# Patient Record
Sex: Male | Born: 1940 | Race: White | Hispanic: No | Marital: Married | Smoking: Former smoker
Health system: Southern US, Community
[De-identification: ages and names within clinical notes are randomized; demographics above are authoritative.]

## PROBLEM LIST (undated history)

## (undated) DIAGNOSIS — F32A Depression, unspecified: Secondary | ICD-10-CM

## (undated) DIAGNOSIS — I10 Essential (primary) hypertension: Secondary | ICD-10-CM

## (undated) DIAGNOSIS — M545 Low back pain, unspecified: Secondary | ICD-10-CM

## (undated) DIAGNOSIS — G8929 Other chronic pain: Secondary | ICD-10-CM

## (undated) DIAGNOSIS — N400 Enlarged prostate without lower urinary tract symptoms: Secondary | ICD-10-CM

## (undated) DIAGNOSIS — Z8489 Family history of other specified conditions: Secondary | ICD-10-CM

## (undated) DIAGNOSIS — J42 Unspecified chronic bronchitis: Secondary | ICD-10-CM

## (undated) DIAGNOSIS — K219 Gastro-esophageal reflux disease without esophagitis: Secondary | ICD-10-CM

## (undated) DIAGNOSIS — M1990 Unspecified osteoarthritis, unspecified site: Secondary | ICD-10-CM

## (undated) DIAGNOSIS — F329 Major depressive disorder, single episode, unspecified: Secondary | ICD-10-CM

## (undated) HISTORY — PX: MOLE REMOVAL: SHX2046

## (undated) HISTORY — PX: LIPOMA EXCISION: SHX5283

## (undated) HISTORY — PX: TONSILLECTOMY: SUR1361

---

## 2013-10-24 ENCOUNTER — Other Ambulatory Visit: Payer: Self-pay | Admitting: Internal Medicine

## 2013-10-24 DIAGNOSIS — R109 Unspecified abdominal pain: Secondary | ICD-10-CM

## 2013-10-25 ENCOUNTER — Encounter (HOSPITAL_COMMUNITY): Payer: Self-pay | Admitting: Emergency Medicine

## 2013-10-25 ENCOUNTER — Emergency Department (HOSPITAL_COMMUNITY): Payer: Medicare Other

## 2013-10-25 ENCOUNTER — Ambulatory Visit
Admission: RE | Admit: 2013-10-25 | Discharge: 2013-10-25 | Disposition: A | Discharge status: Home / Self Care | Payer: Medicare Other | Source: Ambulatory Visit | Attending: Internal Medicine | Admitting: Internal Medicine

## 2013-10-25 ENCOUNTER — Observation Stay (HOSPITAL_COMMUNITY)
Admission: EM | Admit: 2013-10-25 | Discharge: 2013-10-27 | Disposition: A | Discharge status: Home / Self Care | Payer: Medicare Other | Attending: Surgery | Admitting: Surgery

## 2013-10-25 DIAGNOSIS — R109 Unspecified abdominal pain: Secondary | ICD-10-CM

## 2013-10-25 DIAGNOSIS — K3580 Unspecified acute appendicitis: Secondary | ICD-10-CM

## 2013-10-25 DIAGNOSIS — I10 Essential (primary) hypertension: Secondary | ICD-10-CM | POA: Insufficient documentation

## 2013-10-25 DIAGNOSIS — K37 Unspecified appendicitis: Principal | ICD-10-CM | POA: Insufficient documentation

## 2013-10-25 DIAGNOSIS — F329 Major depressive disorder, single episode, unspecified: Secondary | ICD-10-CM | POA: Insufficient documentation

## 2013-10-25 DIAGNOSIS — Z87891 Personal history of nicotine dependence: Secondary | ICD-10-CM | POA: Insufficient documentation

## 2013-10-25 DIAGNOSIS — N400 Enlarged prostate without lower urinary tract symptoms: Secondary | ICD-10-CM | POA: Insufficient documentation

## 2013-10-25 DIAGNOSIS — F3289 Other specified depressive episodes: Secondary | ICD-10-CM | POA: Insufficient documentation

## 2013-10-25 HISTORY — DX: Major depressive disorder, single episode, unspecified: F32.9

## 2013-10-25 HISTORY — DX: Family history of other specified conditions: Z84.89

## 2013-10-25 HISTORY — DX: Depression, unspecified: F32.A

## 2013-10-25 HISTORY — DX: Essential (primary) hypertension: I10

## 2013-10-25 HISTORY — DX: Unspecified osteoarthritis, unspecified site: M19.90

## 2013-10-25 HISTORY — DX: Gastro-esophageal reflux disease without esophagitis: K21.9

## 2013-10-25 HISTORY — DX: Benign prostatic hyperplasia without lower urinary tract symptoms: N40.0

## 2013-10-25 LAB — CBC WITH DIFFERENTIAL/PLATELET
Basophils Absolute: 0 10*3/uL (ref 0.0–0.1)
Basophils Relative: 0 % (ref 0–1)
Eosinophils Absolute: 0 10*3/uL (ref 0.0–0.7)
Eosinophils Relative: 0 % (ref 0–5)
HCT: 43.1 % (ref 39.0–52.0)
Hemoglobin: 15.6 g/dL (ref 13.0–17.0)
Lymphocytes Relative: 6 % — ABNORMAL LOW (ref 12–46)
Lymphs Abs: 0.8 10*3/uL (ref 0.7–4.0)
MCH: 33.5 pg (ref 26.0–34.0)
MCHC: 36.2 g/dL — ABNORMAL HIGH (ref 30.0–36.0)
MCV: 92.5 fL (ref 78.0–100.0)
Monocytes Absolute: 0.4 10*3/uL (ref 0.1–1.0)
Monocytes Relative: 3 % (ref 3–12)
Neutro Abs: 12.9 10*3/uL — ABNORMAL HIGH (ref 1.7–7.7)
Neutrophils Relative %: 92 % — ABNORMAL HIGH (ref 43–77)
Platelets: 269 10*3/uL (ref 150–400)
RBC: 4.66 MIL/uL (ref 4.22–5.81)
RDW: 12.1 % (ref 11.5–15.5)
WBC: 14.1 10*3/uL — ABNORMAL HIGH (ref 4.0–10.5)

## 2013-10-25 LAB — COMPREHENSIVE METABOLIC PANEL
ALBUMIN: 3.9 g/dL (ref 3.5–5.2)
ALK PHOS: 66 U/L (ref 39–117)
ALT: 24 U/L (ref 0–53)
AST: 19 U/L (ref 0–37)
BUN: 17 mg/dL (ref 6–23)
CALCIUM: 9.4 mg/dL (ref 8.4–10.5)
CO2: 21 mEq/L (ref 19–32)
CREATININE: 0.73 mg/dL (ref 0.50–1.35)
Chloride: 96 mEq/L (ref 96–112)
GFR calc non Af Amer: >90 mL/min (ref >90)
GLUCOSE: 144 mg/dL — AB (ref 70–99)
Potassium: 3.7 mEq/L (ref 3.7–5.3)
Sodium: 134 mEq/L — ABNORMAL LOW (ref 137–147)
TOTAL PROTEIN: 7.9 g/dL (ref 6.0–8.3)
Total Bilirubin: 0.4 mg/dL (ref 0.3–1.2)

## 2013-10-25 LAB — URINALYSIS, ROUTINE W REFLEX MICROSCOPIC
Bilirubin Urine: NEGATIVE
GLUCOSE, UA: NEGATIVE mg/dL
Ketones, ur: NEGATIVE mg/dL
Leukocytes, UA: NEGATIVE
Nitrite: NEGATIVE
Protein, ur: NEGATIVE mg/dL
Specific Gravity, Urine: 1.019 (ref 1.005–1.030)
UROBILINOGEN UA: 0.2 mg/dL (ref 0.0–1.0)
pH: 6.5 (ref 5.0–8.0)

## 2013-10-25 LAB — URINE MICROSCOPIC-ADD ON

## 2013-10-25 MED ORDER — ONDANSETRON HCL 4 MG/2ML IJ SOLN: 4.0000 mg | Freq: Four times a day (QID) | INTRAMUSCULAR | Status: DC | PRN

## 2013-10-25 MED ORDER — ENOXAPARIN SODIUM 40 MG/0.4ML ~~LOC~~ SOLN
40.0000 mg | SUBCUTANEOUS | Status: DC
  Administered 2013-10-25 – 2013-10-26 (×2): 40 mg via SUBCUTANEOUS
  Filled 2013-10-25 (×3): qty 0.4

## 2013-10-25 MED ORDER — DOCUSATE SODIUM 100 MG PO CAPS
100.0000 mg | ORAL_CAPSULE | Freq: Two times a day (BID) | ORAL | Status: DC
  Administered 2013-10-25 – 2013-10-27 (×4): 100 mg via ORAL
  Filled 2013-10-25 (×5): qty 1

## 2013-10-25 MED ORDER — BIOTENE DRY MOUTH MT LIQD: 15.0000 mL | Freq: Two times a day (BID) | OROMUCOSAL | Status: DC

## 2013-10-25 MED ORDER — DEXTROSE IN LACTATED RINGERS 5 % IV SOLN
INTRAVENOUS | Status: DC
  Administered 2013-10-25: 22:00:00 via INTRAVENOUS

## 2013-10-25 MED ORDER — PIPERACILLIN-TAZOBACTAM 3.375 G IVPB
3.3750 g | Freq: Three times a day (TID) | INTRAVENOUS | Status: DC
  Administered 2013-10-25 – 2013-10-27 (×5): 3.375 g via INTRAVENOUS
  Filled 2013-10-25 (×7): qty 50

## 2013-10-25 MED ORDER — HYDROMORPHONE HCL PF 1 MG/ML IJ SOLN: 1.0000 mg | INTRAMUSCULAR | Status: DC | PRN

## 2013-10-25 MED ORDER — ACETAMINOPHEN 325 MG PO TABS: 650.0000 mg | ORAL_TABLET | Freq: Four times a day (QID) | ORAL | Status: DC | PRN

## 2013-10-25 MED ORDER — LATANOPROST 0.005 % OP SOLN
1.0000 [drp] | Freq: Every day | OPHTHALMIC | Status: DC
  Administered 2013-10-25 – 2013-10-26 (×2): 1 [drp] via OPHTHALMIC
  Filled 2013-10-25: qty 2.5

## 2013-10-25 MED ORDER — OXYCODONE HCL 5 MG PO TABS: 5.0000 mg | ORAL_TABLET | ORAL | Status: DC | PRN

## 2013-10-25 MED ORDER — IOHEXOL 300 MG/ML  SOLN
125.0000 mL | Freq: Once | INTRAMUSCULAR | Status: AC | PRN
  Administered 2013-10-25: 125 mL via INTRAVENOUS

## 2013-10-25 MED ORDER — DUTASTERIDE 0.5 MG PO CAPS
0.5000 mg | ORAL_CAPSULE | Freq: Every day | ORAL | Status: DC
  Administered 2013-10-26: 0.5 mg via ORAL
  Filled 2013-10-25 (×2): qty 1

## 2013-10-25 MED ORDER — CHLORHEXIDINE GLUCONATE 0.12 % MT SOLN
15.0000 mL | Freq: Two times a day (BID) | OROMUCOSAL | Status: DC
  Administered 2013-10-25 – 2013-10-26 (×2): 15 mL via OROMUCOSAL
  Filled 2013-10-25 (×2): qty 15

## 2013-10-25 MED ORDER — PIPERACILLIN-TAZOBACTAM 3.375 G IVPB
3.3750 g | Freq: Three times a day (TID) | INTRAVENOUS | Status: DC
  Filled 2013-10-25: qty 50

## 2013-10-25 MED ORDER — HYDROCHLOROTHIAZIDE 25 MG PO TABS
25.0000 mg | ORAL_TABLET | Freq: Every day | ORAL | Status: DC
  Administered 2013-10-26: 25 mg via ORAL
  Filled 2013-10-25 (×2): qty 1

## 2013-10-25 MED ORDER — IRBESARTAN 150 MG PO TABS
150.0000 mg | ORAL_TABLET | Freq: Every day | ORAL | Status: DC
  Filled 2013-10-25: qty 1

## 2013-10-25 MED ORDER — ACETAMINOPHEN 650 MG RE SUPP: 650.0000 mg | Freq: Four times a day (QID) | RECTAL | Status: DC | PRN

## 2013-10-25 MED ORDER — VALSARTAN-HYDROCHLOROTHIAZIDE 160-25 MG PO TABS: 1.0000 | ORAL_TABLET | Freq: Every day | ORAL | Status: DC

## 2013-10-25 MED ORDER — SENNOSIDES-DOCUSATE SODIUM 8.6-50 MG PO TABS: 1.0000 | ORAL_TABLET | Freq: Every evening | ORAL | Status: DC | PRN

## 2013-10-25 NOTE — ED Notes (Signed)
Attempted to call report, nurse unavailable.

## 2013-10-25 NOTE — ED Notes (Signed)
Paged Dr. Luisa Hartornett to [phone]

## 2013-10-25 NOTE — H&P (Signed)
Clinton Fuller is an 73 y.o. male.   Chief Complaint: RLQ abdominal pain HPI: 4 day hx of RLQ mild to moderate pain better over the last 2 days.  Started Saturday and worsened on Sunday. Location is RLQ.  Now its a 2 out of 10 dull achy.  Overall he is feeling better compared to Monday.  He has received no pain medication today. No vomiting or nausea. Bowels are moving.   Past Medical History  Diagnosis Date  . Hypertension   . Depression   . BPH (benign prostatic hyperplasia)     History reviewed. No pertinent past surgical history.  No family history on file. Social History:  reports that he has quit smoking. He does not have any smokeless tobacco history on file. He reports that he drinks alcohol. His drug history is not on file.  Allergies:  Allergies  Allergen Reactions  . Iodine Hives  . Shellfish Allergy Nausea And Vomiting  . Sulfa Antibiotics Other (See Comments)    Unknown childhood reaction     (Not in a hospital admission)  Results for orders placed during the hospital encounter of 10/25/13 (from the past 48 hour(s))  CBC WITH DIFFERENTIAL     Status: Abnormal   Collection Time    10/25/13  2:04 PM      Result Value Range   WBC 14.1 (*) 4.0 - 10.5 K/uL   RBC 4.66  4.22 - 5.81 MIL/uL   Hemoglobin 15.6  13.0 - 17.0 g/dL   HCT 43.1  39.0 - 52.0 %   MCV 92.5  78.0 - 100.0 fL   MCH 33.5  26.0 - 34.0 pg   MCHC 36.2 (*) 30.0 - 36.0 g/dL   RDW 12.1  11.5 - 15.5 %   Platelets 269  150 - 400 K/uL   Neutrophils Relative % 92 (*) 43 - 77 %   Neutro Abs 12.9 (*) 1.7 - 7.7 K/uL   Lymphocytes Relative 6 (*) 12 - 46 %   Lymphs Abs 0.8  0.7 - 4.0 K/uL   Monocytes Relative 3  3 - 12 %   Monocytes Absolute 0.4  0.1 - 1.0 K/uL   Eosinophils Relative 0  0 - 5 %   Eosinophils Absolute 0.0  0.0 - 0.7 K/uL   Basophils Relative 0  0 - 1 %   Basophils Absolute 0.0  0.0 - 0.1 K/uL  COMPREHENSIVE METABOLIC PANEL     Status: Abnormal   Collection Time    10/25/13  2:04 PM   Result Value Range   Sodium 134 (*) 137 - 147 mEq/L   Potassium 3.7  3.7 - 5.3 mEq/L   Chloride 96  96 - 112 mEq/L   CO2 21  19 - 32 mEq/L   Glucose, Bld 144 (*) 70 - 99 mg/dL   BUN 17  6 - 23 mg/dL   Creatinine, Ser 0.73  0.50 - 1.35 mg/dL   Calcium 9.4  8.4 - 10.5 mg/dL   Total Protein 7.9  6.0 - 8.3 g/dL   Albumin 3.9  3.5 - 5.2 g/dL   AST 19  0 - 37 U/L   ALT 24  0 - 53 U/L   Alkaline Phosphatase 66  39 - 117 U/L   Total Bilirubin 0.4  0.3 - 1.2 mg/dL   GFR calc non Af Amer >90  >90 mL/min   GFR calc Af Amer >90  >90 mL/min   Comment: (NOTE)     The eGFR  has been calculated using the CKD EPI equation.     This calculation has not been validated in all clinical situations.     eGFR's persistently <90 mL/min signify possible Chronic Kidney     Disease.  URINALYSIS, ROUTINE W REFLEX MICROSCOPIC     Status: Abnormal   Collection Time    10/25/13  2:28 PM      Result Value Range   Color, Urine YELLOW  YELLOW   APPearance CLEAR  CLEAR   Specific Gravity, Urine 1.019  1.005 - 1.030   pH 6.5  5.0 - 8.0   Glucose, UA NEGATIVE  NEGATIVE mg/dL   Hgb urine dipstick TRACE (*) NEGATIVE   Bilirubin Urine NEGATIVE  NEGATIVE   Ketones, ur NEGATIVE  NEGATIVE mg/dL   Protein, ur NEGATIVE  NEGATIVE mg/dL   Urobilinogen, UA 0.2  0.0 - 1.0 mg/dL   Nitrite NEGATIVE  NEGATIVE   Leukocytes, UA NEGATIVE  NEGATIVE  URINE MICROSCOPIC-ADD ON     Status: None   Collection Time    10/25/13  2:28 PM      Result Value Range   RBC / HPF 0-2  <3 RBC/hpf   Dg Chest 2 View  10/25/2013   CLINICAL DATA:  Acute appendicitis  EXAM: CHEST  2 VIEW  COMPARISON:  None.  FINDINGS: Cardiomediastinal silhouette is unremarkable. No acute infiltrate or pleural effusion. No pulmonary edema. Mild degenerative changes mid thoracic spine.  IMPRESSION: No active cardiopulmonary disease.   Electronically Signed   By: Lahoma Crocker M.D.   On: 10/25/2013 15:12   Ct Abdomen Pelvis W Contrast  10/25/2013   CLINICAL DATA:   Right lower quadrant pain for 4 days, leukocytosis, question appendicitis  EXAM: CT ABDOMEN AND PELVIS WITH CONTRAST  TECHNIQUE: Multidetector CT imaging of the abdomen and pelvis was performed using the standard protocol following bolus administration of intravenous contrast. Sagittal and coronal MPR images reconstructed from axial data set.  CONTRAST:  126m OMNIPAQUE IOHEXOL 300 MG/ML SOLN. Dilute oral contrast.  COMPARISON:  None  FINDINGS: Lung bases clear.  Liver, spleen, pancreas, kidneys, and adrenal glands normal appearance.  Thickened enlarged appendix with periappendiceal inflammatory changes and thickening of the adjacent cecal wall consistent with acute appendicitis.  No evidence of discrete abscess or perforation.  Few adjacent normal sized lymph nodes.  Stomach and bowel loops otherwise grossly normal appearance.  Minimal scattered atherosclerotic calcification.  Mild prostatic enlargement and associated bladder wall thickening.  No mass, adenopathy, free fluid, or free air.  No hernia or acute osseous findings identified.  IMPRESSION: Acute appendicitis.  Findings called to Dr. PDelfina Redwoodon 10/25/2013 at 1314 hr.   Electronically Signed   By: MLavonia DanaM.D.   On: 10/25/2013 13:15    Review of Systems  Constitutional: Negative for fever and chills.  HENT: Negative.   Eyes: Negative.   Respiratory: Negative.   Cardiovascular: Negative.   Gastrointestinal: Positive for abdominal pain.  Genitourinary: Negative.   Musculoskeletal: Negative.   Skin: Negative.   Neurological: Negative.   Endo/Heme/Allergies: Negative.   Psychiatric/Behavioral: Negative.     Blood pressure 149/80, pulse 104, temperature 98.8 F (37.1 C), weight 228 lb (103.42 kg), SpO2 96.00%. Physical Exam  Constitutional: He appears well-developed and well-nourished.  HENT:  Head: Normocephalic and atraumatic.  Eyes: No scleral icterus.  Neck: Normal range of motion.  Cardiovascular: Normal rate and regular rhythm.    Respiratory: Effort normal and breath sounds normal.  GI:  Assessment/Plan Appendicitis Discussed medical and surgical options with him and the pro and cons to each.  He has minimal pain and his exam is under impressive. He has had symptoms for 4 days and after looking at the  CT myself suspect he has perforated.  He is an ideal candidate to manage initially with antibiotics and follow since he has minimal pain.  He has no drainable collections. If he worsens ,  Surgical exploration may be necessary. He understands his options and agrees with the plan.    Selwyn Reason A. 10/25/2013, 4:49 PM

## 2013-10-25 NOTE — ED Notes (Signed)
abd pain since last  Seen by dr  was sent for  Ct and has appendicitis here for admission

## 2013-10-26 DIAGNOSIS — D72829 Elevated white blood cell count, unspecified: Secondary | ICD-10-CM

## 2013-10-26 LAB — COMPREHENSIVE METABOLIC PANEL
ALT: 20 U/L (ref 0–53)
AST: 19 U/L (ref 0–37)
Albumin: 3.3 g/dL — ABNORMAL LOW (ref 3.5–5.2)
Alkaline Phosphatase: 56 U/L (ref 39–117)
BILIRUBIN TOTAL: 0.4 mg/dL (ref 0.3–1.2)
BUN: 17 mg/dL (ref 6–23)
CALCIUM: 9 mg/dL (ref 8.4–10.5)
CHLORIDE: 99 meq/L (ref 96–112)
CO2: 22 mEq/L (ref 19–32)
CREATININE: 0.91 mg/dL (ref 0.50–1.35)
GFR calc Af Amer: >90 mL/min (ref >90)
GFR, EST NON AFRICAN AMERICAN: 84 mL/min — AB (ref >90)
Glucose, Bld: 114 mg/dL — ABNORMAL HIGH (ref 70–99)
Potassium: 3.2 mEq/L — ABNORMAL LOW (ref 3.7–5.3)
Sodium: 138 mEq/L (ref 137–147)
Total Protein: 6.6 g/dL (ref 6.0–8.3)

## 2013-10-26 LAB — CBC
HCT: 39.1 % (ref 39.0–52.0)
HEMOGLOBIN: 13.7 g/dL (ref 13.0–17.0)
MCH: 33.1 pg (ref 26.0–34.0)
MCHC: 35 g/dL (ref 30.0–36.0)
MCV: 94.4 fL (ref 78.0–100.0)
PLATELETS: 254 10*3/uL (ref 150–400)
RBC: 4.14 MIL/uL — AB (ref 4.22–5.81)
RDW: 12.3 % (ref 11.5–15.5)
WBC: 12.6 10*3/uL — AB (ref 4.0–10.5)

## 2013-10-26 MED ORDER — POTASSIUM CL IN DEXTROSE 5% 20 MEQ/L IV SOLN
20.0000 meq | INTRAVENOUS | Status: DC
  Administered 2013-10-26 – 2013-10-27 (×2): 20 meq via INTRAVENOUS
  Filled 2013-10-26 (×3): qty 1000

## 2013-10-26 NOTE — Progress Notes (Signed)
Finish 48 hours total of IV abx and transition to po abx. HOME Friday.

## 2013-10-26 NOTE — Progress Notes (Signed)
Subjective: Pt feels fine.  No N/V, and only minimal abdominal pain in RLQ.  Ambulating some, Using IS at 2250.  Hungry. Urinating well and had a BM yesterday.  Objective: Vital signs in last 24 hours: Temp:  [98.2 F (36.8 C)-98.8 F (37.1 C)] 98.2 F (36.8 C) (01/15 0535) Pulse Rate:  [67-104] 67 (01/15 0535) Resp:  [16-18] 18 (01/15 0535) BP: (130-156)/(55-81) 130/55 mmHg (01/15 0535) SpO2:  [94 %-100 %] 95 % (01/15 0535) Weight:  [228 lb (103.42 kg)] 228 lb (103.42 kg) (01/14 1954) Last BM Date: 10/25/13  Intake/Output from previous day:   Intake/Output this shift:    PE: Gen:  Alert, NAD, pleasant Abd: Soft, minimal tenderness in RLQ, ND, +BS, no HSM   Lab Results:   Recent Labs  10/25/13 1404 10/26/13 0420  WBC 14.1* 12.6*  HGB 15.6 13.7  HCT 43.1 39.1  PLT 269 254   BMET  Recent Labs  10/25/13 1404 10/26/13 0420  NA 134* 138  K 3.7 3.2*  CL 96 99  CO2 21 22  GLUCOSE 144* 114*  BUN 17 17  CREATININE 0.73 0.91  CALCIUM 9.4 9.0   PT/INR No results found for this basename: LABPROT, INR,  in the last 72 hours CMP     Component Value Date/Time   NA 138 10/26/2013 0420   K 3.2* 10/26/2013 0420   CL 99 10/26/2013 0420   CO2 22 10/26/2013 0420   GLUCOSE 114* 10/26/2013 0420   BUN 17 10/26/2013 0420   CREATININE 0.91 10/26/2013 0420   CALCIUM 9.0 10/26/2013 0420   PROT 6.6 10/26/2013 0420   ALBUMIN 3.3* 10/26/2013 0420   AST 19 10/26/2013 0420   ALT 20 10/26/2013 0420   ALKPHOS 56 10/26/2013 0420   BILITOT 0.4 10/26/2013 0420   GFRNONAA 84* 10/26/2013 0420   GFRAA >90 10/26/2013 0420   Lipase  No results found for this basename: lipase       Studies/Results: Dg Chest 2 View  10/25/2013   CLINICAL DATA:  Acute appendicitis  EXAM: CHEST  2 VIEW  COMPARISON:  None.  FINDINGS: Cardiomediastinal silhouette is unremarkable. No acute infiltrate or pleural effusion. No pulmonary edema. Mild degenerative changes mid thoracic spine.  IMPRESSION: No active  cardiopulmonary disease.   Electronically Signed   By: Natasha MeadLiviu  Pop M.D.   On: 10/25/2013 15:12   Ct Abdomen Pelvis W Contrast  10/25/2013   CLINICAL DATA:  Right lower quadrant pain for 4 days, leukocytosis, question appendicitis  EXAM: CT ABDOMEN AND PELVIS WITH CONTRAST  TECHNIQUE: Multidetector CT imaging of the abdomen and pelvis was performed using the standard protocol following bolus administration of intravenous contrast. Sagittal and coronal MPR images reconstructed from axial data set.  CONTRAST:  125mL OMNIPAQUE IOHEXOL 300 MG/ML SOLN. Dilute oral contrast.  COMPARISON:  None  FINDINGS: Lung bases clear.  Liver, spleen, pancreas, kidneys, and adrenal glands normal appearance.  Thickened enlarged appendix with periappendiceal inflammatory changes and thickening of the adjacent cecal wall consistent with acute appendicitis.  No evidence of discrete abscess or perforation.  Few adjacent normal sized lymph nodes.  Stomach and bowel loops otherwise grossly normal appearance.  Minimal scattered atherosclerotic calcification.  Mild prostatic enlargement and associated bladder wall thickening.  No mass, adenopathy, free fluid, or free air.  No hernia or acute osseous findings identified.  IMPRESSION: Acute appendicitis.  Findings called to Dr. Nehemiah SettlePolite on 10/25/2013 at 1314 hr.   Electronically Signed   By: Angelyn PuntMark  Boles M.D.  On: 10/25/2013 13:15    Anti-infectives: Anti-infectives   Start     Dose/Rate Route Frequency Ordered Stop   10/25/13 1800  piperacillin-tazobactam (ZOSYN) IVPB 3.375 g     3.375 g 12.5 mL/hr over 240 Minutes Intravenous 3 times per day 10/25/13 1749     10/25/13 1715  piperacillin-tazobactam (ZOSYN) IVPB 3.375 g  Status:  Discontinued     3.375 g 12.5 mL/hr over 240 Minutes Intravenous 3 times per day 10/25/13 1704 10/25/13 1748       Assessment/Plan Sub-acute appendicitis Leukocytosis RLQ abdominal pain  Plan: 1.  Treat conservatively with IV antibiotics (Zosyn), no  drainable fluid collection 2.  If he improves then may be able to avoid surgery or perform an interval lap appy at a later date 3.  Ambulate and IS 4.  SCD's and lovenox 5.  Advance to reg diet, switch to oral Augmentin tomorrow if WBC normal and pain minimal 6.  May be ready for d/c tomorrow    LOS: 1 day    Clinton Fuller 10/26/2013, 10:56 AM Pager: [phone]

## 2013-10-27 LAB — BASIC METABOLIC PANEL
BUN: 17 mg/dL (ref 6–23)
CHLORIDE: 100 meq/L (ref 96–112)
CO2: 24 meq/L (ref 19–32)
CREATININE: 1.05 mg/dL (ref 0.50–1.35)
Calcium: 8.4 mg/dL (ref 8.4–10.5)
GFR calc Af Amer: 81 mL/min — ABNORMAL LOW (ref >90)
GFR calc non Af Amer: 70 mL/min — ABNORMAL LOW (ref >90)
Glucose, Bld: 100 mg/dL — ABNORMAL HIGH (ref 70–99)
POTASSIUM: 3.5 meq/L — AB (ref 3.7–5.3)
Sodium: 139 mEq/L (ref 137–147)

## 2013-10-27 LAB — CBC
HEMATOCRIT: 38.3 % — AB (ref 39.0–52.0)
HEMOGLOBIN: 13.4 g/dL (ref 13.0–17.0)
MCH: 33.1 pg (ref 26.0–34.0)
MCHC: 35 g/dL (ref 30.0–36.0)
MCV: 94.6 fL (ref 78.0–100.0)
Platelets: 247 10*3/uL (ref 150–400)
RBC: 4.05 MIL/uL — AB (ref 4.22–5.81)
RDW: 12.2 % (ref 11.5–15.5)
WBC: 7.2 10*3/uL (ref 4.0–10.5)

## 2013-10-27 MED ORDER — DSS 100 MG PO CAPS: 100.0000 mg | ORAL_CAPSULE | Freq: Two times a day (BID) | ORAL | Status: DC

## 2013-10-27 MED ORDER — OXYCODONE HCL 5 MG PO TABS: 5.0000 mg | ORAL_TABLET | Freq: Four times a day (QID) | ORAL | Status: DC | PRN

## 2013-10-27 MED ORDER — AMOXICILLIN-POT CLAVULANATE 875-125 MG PO TABS: 1.0000 | ORAL_TABLET | Freq: Two times a day (BID) | ORAL | Status: DC

## 2013-10-27 NOTE — Discharge Summary (Signed)
PT DOING WELL ON MEDICAL MANAGEMENT. WILL NEED FOLLOW UP IN 10 14 DAYS AND COMPLETE ANTIBIOTICS.

## 2013-10-27 NOTE — Discharge Instructions (Signed)
Your appendicitis is being treated without surgery and with only antibiotics because you are at higher risk during surgery given the length of time of your pain.  You may or may not need to get your appendix out at a later date.  If your pain worsens please call the office for a sooner appointment.  Please take your antibiotics until they are gone.    Appendicitis Appendicitis is when the appendix is swollen (inflamed). The inflammation can lead to developing a hole (perforation) and a collection of pus (abscess). CAUSES  There is not always an obvious cause of appendicitis. Sometimes it is caused by an obstruction in the appendix. The obstruction can be caused by:  A small, hard, pea-sized ball of stool (fecalith).  Enlarged lymph glands in the appendix. SYMPTOMS   Pain around your belly button (navel) that moves toward your lower right belly (abdomen). The pain can become more severe and sharp as time passes.  Tenderness in the lower right abdomen. Pain gets worse if you cough or make a sudden movement.  Feeling sick to your stomach (nauseous).  Throwing up (vomiting).  Loss of appetite.  Fever.  Constipation.  Diarrhea.  Generally not feeling well. DIAGNOSIS   Physical exam.  Blood tests.  Urine test.  X-rays or a CT scan may confirm the diagnosis. TREATMENT  Once the diagnosis of appendicitis is made, the most common treatment is to remove the appendix as soon as possible. This procedure is called appendectomy. In an open appendectomy, a cut (incision) is made in the lower right abdomen and the appendix is removed. In a laparoscopic appendectomy, usually 3 small incisions are made. Long, thin instruments and a camera tube are used to remove the appendix. Most patients go home in 24 to 48 hours after appendectomy. In some situations, the appendix may have already perforated and an abscess may have formed. The abscess may have a "wall" around it as seen on a CT scan. In this  case, a drain may be placed into the abscess to remove fluid, and you may be treated with antibiotic medicines that kill germs. The medicine is given through a tube in your vein (IV). Once the abscess has resolved, it may or may not be necessary to have an appendectomy. You may need to stay in the hospital longer than 48 hours. Document Released: 09/28/2005 Document Revised: 03/29/2012 Document Reviewed: 12/24/2009 [facility] Patient Information 2014 [place], [place].

## 2013-10-27 NOTE — Discharge Planning (Signed)
Patient discharged home in stable condition. Verbalizes understanding of all discharge instructions, including home medications and follow up appointments. 

## 2013-10-27 NOTE — Discharge Summary (Signed)
Physician Discharge Summary  Patient ID: Clinton Fuller MRN: [id number] DOB/AGE: Jul 20, 1941 73 y.o.  Admit date: 10/25/2013 Discharge date: 10/27/2013  Admitting Diagnosis: Appendicitis Leukocytosis  Discharge Diagnosis Patient Active Problem List   Diagnosis Date Noted  . Appendicitis 10/25/2013    Consultants None  Imaging: Dg Chest 2 View  10/25/2013   CLINICAL DATA:  Acute appendicitis  EXAM: CHEST  2 VIEW  COMPARISON:  None.  FINDINGS: Cardiomediastinal silhouette is unremarkable. No acute infiltrate or pleural effusion. No pulmonary edema. Mild degenerative changes mid thoracic spine.  IMPRESSION: No active cardiopulmonary disease.   Electronically Signed   By: Clinton MeadLiviu  Fuller M.D.   On: 10/25/2013 15:12   Ct Abdomen Pelvis W Contrast  10/25/2013   CLINICAL DATA:  Right lower quadrant pain for 4 days, leukocytosis, question appendicitis  EXAM: CT ABDOMEN AND PELVIS WITH CONTRAST  TECHNIQUE: Multidetector CT imaging of the abdomen and pelvis was performed using the standard protocol following bolus administration of intravenous contrast. Sagittal and coronal MPR images reconstructed from axial data set.  CONTRAST:  125mL OMNIPAQUE IOHEXOL 300 MG/ML SOLN. Dilute oral contrast.  COMPARISON:  None  FINDINGS: Lung bases clear.  Liver, spleen, pancreas, kidneys, and adrenal glands normal appearance.  Thickened enlarged appendix with periappendiceal inflammatory changes and thickening of the adjacent cecal wall consistent with acute appendicitis.  No evidence of discrete abscess or perforation.  Few adjacent normal sized lymph nodes.  Stomach and bowel loops otherwise grossly normal appearance.  Minimal scattered atherosclerotic calcification.  Mild prostatic enlargement and associated bladder wall thickening.  No mass, adenopathy, free fluid, or free air.  No hernia or acute osseous findings identified.  IMPRESSION: Acute appendicitis.  Findings called to Clinton Fuller on 10/25/2013 at 1314 hr.    Electronically Signed   By: Clinton SouthwardMark  Fuller M.D.   On: 10/25/2013 13:15    Procedures None  Hospital Course:  73 y/o male [facility] 4 day hx of RLQ mild to moderate pain better over the since 10/21/13. Started Saturday and worsened on Sunday. Location is RLQ. Now its a 2 out of 10 dull achy. Overall he is feeling better compared to Monday. He has received no pain medication today. No vomiting or nausea. Bowels are moving.   Workup showed appendicitis with evidence of local perforation and leukocytosis.  Patient was admitted to the floor.  We treated conservatively given the delayed presentation and the higher risk of converting to open procedure due to severe inflammation.  This scenario would make it harder to remove.  Antibiotics alone seemed to resolve his pain and he was advanced to a regular diet which he tolerated well.  On HD #3, the patient was voiding well, tolerating diet, ambulating well, pain well controlled, vital signs stable, incisions c/d/i and felt stable for discharge home.  Patient will follow up in our office in 10-14 days with Clinton Fuller and knows to call with questions or concerns.  Physical Exam: General:  Alert, NAD, pleasant, comfortable Abd:  Soft, ND, mild tenderness in RLQ, no HSM     Medication List         amoxicillin-clavulanate 875-125 MG per tablet  Commonly known as:  AUGMENTIN  Take 1 tablet by mouth 2 (two) times daily.     aspirin EC 81 MG tablet  Take 81 mg by mouth daily after lunch.     AVODART 0.5 MG capsule  Generic drug:  dutasteride  Take 0.5 mg by mouth daily after lunch.  diphenhydrAMINE 25 MG tablet  Commonly known as:  BENADRYL  Take 50 mg by mouth once. For cat scan prep:  Take 2 tablets at 11am 10/25/13     DSS 100 MG Caps  Take 100 mg by mouth 2 (two) times daily.     latanoprost 0.005 % ophthalmic solution  Commonly known as:  XALATAN  Place 1 drop into both eyes at bedtime.     multivitamin with minerals  Tabs tablet  Take 1 tablet by mouth daily after lunch.     omeprazole 20 MG tablet  Commonly known as:  PRILOSEC OTC  Take 20 mg by mouth daily after lunch.     oxyCODONE 5 MG immediate release tablet  Commonly known as:  Oxy IR/ROXICODONE  Take 1-2 tablets (5-10 mg total) by mouth every 6 (six) hours as needed for moderate pain.     predniSONE 20 MG tablet  Commonly known as:  DELTASONE  Take 20 mg by mouth See admin instructions. Cat scan prep- take 2 1/5 tablets at 11pm 10/24/13, at 5am 10/25/13, and at 11am 10/25/13     valsartan-hydrochlorothiazide 160-25 MG per tablet  Commonly known as:  DIOVAN-HCT  Take 1 tablet by mouth daily after lunch.             Follow-up Information   Follow up with Fuller,THOMAS A., MD In 10 days. (THE OFFICE SHOULD CALL YOU WITH AN APPOINTMENT TIME/DATE)    Specialty:  General Surgery   Contact information:   [address] [place] [address] [phone]       Signed: Candiss Norse [facility] Fuller [phone]  10/27/2013, 11:01 AM

## 2013-10-31 ENCOUNTER — Telehealth (INDEPENDENT_AMBULATORY_CARE_PROVIDER_SITE_OTHER): Payer: Self-pay

## 2013-10-31 NOTE — Telephone Encounter (Signed)
Called pt with appt info 

## 2013-10-31 NOTE — Telephone Encounter (Signed)
Message copied by Brennan BaileyBROOKS, Lennis Rader on Tue Oct 31, 2013  1:57 PM ------      Message from: BenedictDORT, Rueben BashMEGAN N      Created: Fri Oct 27, 2013 10:57 AM       Schedule with cornett in 10-14 days for recheck            Ruptured appendicitis treated conservatively with antibiotics ------

## 2013-11-03 ENCOUNTER — Encounter (INDEPENDENT_AMBULATORY_CARE_PROVIDER_SITE_OTHER): Payer: Self-pay | Admitting: Surgery

## 2013-11-03 ENCOUNTER — Ambulatory Visit (INDEPENDENT_AMBULATORY_CARE_PROVIDER_SITE_OTHER): Payer: Medicare Other | Admitting: Surgery

## 2013-11-03 VITALS — BP 136/72 | HR 80 | Temp 98.2°F | Resp 14 | Ht 66.0 in | Wt 228.8 lb

## 2013-11-03 DIAGNOSIS — K3580 Unspecified acute appendicitis: Secondary | ICD-10-CM

## 2013-11-03 NOTE — Progress Notes (Signed)
Subjective:     Patient ID: Clinton Fuller, male   DOB: Jun 24, 1941, 73 y.o.   MRN: [id number]  HPI  Patient returns for followup after being admitted to the hospital last week with appendicitis. At the time of evaluation, he was doing a lot better. He was managed medically with antibiotics has improved. He has minimal right upper quadrant abdominal pain. He has no fever or chills. He does have some loose stool but not frank diarrhea. No nausea or vomiting. Review of Systems  HENT: Negative.   Cardiovascular: Negative.   Gastrointestinal: Positive for abdominal pain.       Objective:   Physical Exam  Constitutional: He appears well-developed and well-nourished.  HENT:  Head: Normocephalic.  Eyes: No scleral icterus.  Abdominal: Soft. He exhibits no distension and no mass. There is tenderness. There is no rebound and no guarding.  Skin: Skin is warm and dry.  Psychiatric: He has a normal mood and affect. His behavior is normal. Judgment and thought content normal.       Assessment:     Acute appendicitis improving on medical management    Plan:     Complete Augmentin. Add fiber to diet. Stop stool softener. Return to clinic 2 weeks. May need repeat CT scan if discomfort is not completely improved.

## 2013-11-03 NOTE — Patient Instructions (Signed)
Return 2 weeks.  Add fiber supplement daily  Metamucil,  citrucell etc.  Finish  Antibiotics.

## 2013-11-17 ENCOUNTER — Encounter (INDEPENDENT_AMBULATORY_CARE_PROVIDER_SITE_OTHER): Payer: Self-pay | Admitting: Surgery

## 2013-11-17 ENCOUNTER — Ambulatory Visit (INDEPENDENT_AMBULATORY_CARE_PROVIDER_SITE_OTHER): Payer: Medicare Other | Admitting: Surgery

## 2013-11-17 VITALS — BP 126/80 | HR 78 | Temp 98.0°F | Resp 18 | Ht 66.0 in | Wt 228.0 lb

## 2013-11-17 DIAGNOSIS — K3580 Unspecified acute appendicitis: Secondary | ICD-10-CM

## 2013-11-17 MED ORDER — DIPHENHYDRAMINE HCL 25 MG PO TABS: 25.0000 mg | ORAL_TABLET | Freq: Once | ORAL | Status: DC

## 2013-11-17 MED ORDER — PREDNISONE 20 MG PO TABS: 20.0000 mg | ORAL_TABLET | ORAL | Status: DC

## 2013-11-17 NOTE — Progress Notes (Signed)
Subjective:     Patient ID: Clinton Fuller, male   DOB: [birth year], 73 y.o.   MRN: [id number]  HPI  Patient returns for followup after being admitted to the hospital last week with appendicitis. At the time of evaluation, he was doing a lot better. He was managed medically with antibiotics has improved. He has minimal right upper quadrant abdominal pain. He has no fever or chills. He does have some loose stool but not frank diarrhea. No nausea or vomiting. Review of Systems  HENT: Negative.   Cardiovascular: Negative.   Gastrointestinal: Positive for abdominal pain.       Objective:   Physical Exam  Constitutional: He appears well-developed and well-nourished.  HENT:  Head: Normocephalic.  Eyes: No scleral icterus.  Abdominal: Soft. He exhibits no distension and no mass. There is tenderness. There is no rebound and no guarding.  Skin: Skin is warm and dry.  Psychiatric: He has a normal mood and affect. His behavior is normal. Judgment and thought content normal.       Assessment:     Acute appendicitis improving on medical management but still has mild RLQ abdominal pain    Plan:     C  Since he has some mild pain will set up CT for follow up.  He looks good otherwise but is not pain free.  No fever or chills. Premeds refilled.

## 2013-11-17 NOTE — Patient Instructions (Signed)
Medications are refilled as before for CT scan.  Return 2 weeks.

## 2013-11-20 ENCOUNTER — Telehealth (INDEPENDENT_AMBULATORY_CARE_PROVIDER_SITE_OTHER): Payer: Self-pay | Admitting: *Deleted

## 2013-11-20 NOTE — Telephone Encounter (Signed)
I spoke with pt and informed him of the appt for his CT scan at [facility] on 11/23/13 with an arrival time of 3:00pm.  I instructed him to drink the first bottle of contrast at 1:00pm and second bottle at 2:00pm.  Informed him to have no solid foods 4 hours prior to scan.  He is agreeable with information provided.  He will call [facility] to find out when to take his prednisone and benadryl prior to the scan due to his iodine allergy.  Phone number provided.

## 2013-11-23 ENCOUNTER — Ambulatory Visit
Admission: RE | Admit: 2013-11-23 | Discharge: 2013-11-23 | Disposition: A | Discharge status: Home / Self Care | Payer: Medicare Other | Source: Ambulatory Visit | Attending: Surgery | Admitting: Surgery

## 2013-11-23 DIAGNOSIS — K3580 Unspecified acute appendicitis: Secondary | ICD-10-CM

## 2013-11-23 MED ORDER — IOHEXOL 300 MG/ML  SOLN
125.0000 mL | Freq: Once | INTRAMUSCULAR | Status: AC | PRN
  Administered 2013-11-23: 125 mL via INTRAVENOUS

## 2013-11-24 ENCOUNTER — Telehealth (INDEPENDENT_AMBULATORY_CARE_PROVIDER_SITE_OTHER): Payer: Self-pay

## 2013-11-24 NOTE — Telephone Encounter (Signed)
LMOM> CT normal. Everything has resolved.

## 2013-11-24 NOTE — Telephone Encounter (Signed)
Patient returned call to the office.  CT results (Normal) everything has resolved.

## 2013-11-24 NOTE — Telephone Encounter (Signed)
Message copied by Brennan BaileyBROOKS, Lamija Besse on Fri Nov 24, 2013  2:22 PM ------      Message from: Harriette BouillonORNETT, THOMAS A      Created: Fri Nov 24, 2013 12:35 PM       Looks good everything has resolved. ------

## 2013-12-01 ENCOUNTER — Encounter (INDEPENDENT_AMBULATORY_CARE_PROVIDER_SITE_OTHER): Payer: Self-pay | Admitting: Surgery

## 2013-12-01 ENCOUNTER — Ambulatory Visit (INDEPENDENT_AMBULATORY_CARE_PROVIDER_SITE_OTHER): Payer: Medicare Other | Admitting: Surgery

## 2013-12-01 VITALS — BP 146/86 | HR 72 | Temp 99.2°F | Resp 14 | Ht 66.0 in | Wt 232.6 lb

## 2013-12-01 DIAGNOSIS — K3580 Unspecified acute appendicitis: Secondary | ICD-10-CM | POA: Insufficient documentation

## 2013-12-01 NOTE — Progress Notes (Signed)
Subjective:     Patient ID: Clinton Fuller, male   DOB: [birth year], 73 y.o.   MRN: [id number]  HPI  Patient returns for followup after being admitted to the hospital last month with appendicitis. At the time of evaluation, he was doing a lot better. He was managed medically with antibiotics has improved. He has no  right lower quadrant abdominal pain. He has no fever or chills. He has a runny nose and cold now.    Review of Systems  HENT: Negative.   Cardiovascular: Negative.   Gastrointestinal: negative  for abdominal pain.  Cough and runny nose     Objective:   Physical Exam  Constitutional: He appears well-developed and well-nourished.  HENT:  Head: Normocephalic.  Eyes: No scleral icterus.  Abdominal: Soft. He exhibits no distension and no mass. There is no  tenderness. There is no rebound and no guarding.  Skin: Skin is warm and dry.  Psychiatric: He has a normal mood and affect. His behavior is normal. Judgment and thought content normal.  CLINICAL DATA: Right-sided abdominal pain.  EXAM:  CT ABDOMEN AND PELVIS WITH CONTRAST  TECHNIQUE:  Multidetector CT imaging of the abdomen and pelvis was performed  using the standard protocol following bolus administration of  intravenous contrast.  CONTRAST: 125mL OMNIPAQUE IOHEXOL 300 MG/ML SOLN  COMPARISON: CT ABD/PELVIS W CM dated 10/25/2013  FINDINGS:  Lung bases show no acute findings. Heart size normal. No pericardial  or pleural effusion.  Liver may be slightly decreased in attenuation diffusely. Liver,  gallbladder, adrenal glands, kidneys, spleen, pancreas, stomach and  bowel are otherwise unremarkable. Appendix is normal. Ileocolic  mesenteric lymph nodes are subcentimeter in short axis size.  Scattered atherosclerotic calcification of the arterial vasculature  without abdominal aortic aneurysm. Prostate is enlarged, measuring  5.1 cm. No free fluid. No worrisome lytic or sclerotic lesions.  Degenerative changes are seen  in the spine.  IMPRESSION:  1. No acute findings. Inflammatory changes associated with the  appendix on the prior exam have resolved in the interval.  2. Liver appears slightly fatty.  3. Enlarged prostate.      Assessment:     Acute appendicitis resolved  on medical management but still has mild RLQ abdominal pain    Plan:        pt has recovered. Discussed the role of interval appendectomy.  The literature of medical management of appendicitis reviewed as well.  No further treatment needed unless symptoms return. Pt states he has had a colonoscopy within last 5 years.

## 2013-12-01 NOTE — Patient Instructions (Signed)
Return as needed.  Call if symptoms return.

## 2014-02-02 ENCOUNTER — Encounter (HOSPITAL_COMMUNITY)
Admission: EM | Disposition: A | Discharge status: Home / Self Care | Payer: Self-pay | Source: Home / Self Care | Attending: Emergency Medicine

## 2014-02-02 ENCOUNTER — Emergency Department (HOSPITAL_COMMUNITY): Payer: Medicare Other

## 2014-02-02 ENCOUNTER — Observation Stay (HOSPITAL_COMMUNITY)
Admission: EM | Admit: 2014-02-02 | Discharge: 2014-02-04 | Disposition: A | Discharge status: Home / Self Care | Payer: Medicare Other | Attending: Surgery | Admitting: Surgery

## 2014-02-02 ENCOUNTER — Encounter (HOSPITAL_COMMUNITY): Payer: Medicare Other | Admitting: Certified Registered Nurse Anesthetist

## 2014-02-02 ENCOUNTER — Observation Stay (HOSPITAL_COMMUNITY): Payer: Medicare Other | Admitting: Certified Registered Nurse Anesthetist

## 2014-02-02 ENCOUNTER — Encounter (HOSPITAL_COMMUNITY): Payer: Self-pay | Admitting: Emergency Medicine

## 2014-02-02 DIAGNOSIS — Z87891 Personal history of nicotine dependence: Secondary | ICD-10-CM | POA: Insufficient documentation

## 2014-02-02 DIAGNOSIS — F329 Major depressive disorder, single episode, unspecified: Secondary | ICD-10-CM | POA: Insufficient documentation

## 2014-02-02 DIAGNOSIS — I10 Essential (primary) hypertension: Secondary | ICD-10-CM | POA: Insufficient documentation

## 2014-02-02 DIAGNOSIS — M129 Arthropathy, unspecified: Secondary | ICD-10-CM | POA: Insufficient documentation

## 2014-02-02 DIAGNOSIS — N400 Enlarged prostate without lower urinary tract symptoms: Secondary | ICD-10-CM | POA: Insufficient documentation

## 2014-02-02 DIAGNOSIS — F3289 Other specified depressive episodes: Secondary | ICD-10-CM | POA: Insufficient documentation

## 2014-02-02 DIAGNOSIS — K3580 Unspecified acute appendicitis: Secondary | ICD-10-CM

## 2014-02-02 DIAGNOSIS — R1031 Right lower quadrant pain: Secondary | ICD-10-CM

## 2014-02-02 DIAGNOSIS — K219 Gastro-esophageal reflux disease without esophagitis: Secondary | ICD-10-CM | POA: Insufficient documentation

## 2014-02-02 DIAGNOSIS — K37 Unspecified appendicitis: Secondary | ICD-10-CM

## 2014-02-02 HISTORY — DX: Low back pain, unspecified: M54.50

## 2014-02-02 HISTORY — DX: Other chronic pain: G89.29

## 2014-02-02 HISTORY — DX: Unspecified chronic bronchitis: J42

## 2014-02-02 HISTORY — PX: APPENDECTOMY: SHX54

## 2014-02-02 HISTORY — DX: Low back pain: M54.5

## 2014-02-02 HISTORY — PX: LAPAROSCOPIC APPENDECTOMY: SHX408

## 2014-02-02 LAB — COMPREHENSIVE METABOLIC PANEL
ALT: 29 U/L (ref 0–53)
AST: 25 U/L (ref 0–37)
Albumin: 4 g/dL (ref 3.5–5.2)
Alkaline Phosphatase: 49 U/L (ref 39–117)
BUN: 20 mg/dL (ref 6–23)
CO2: 22 mEq/L (ref 19–32)
Calcium: 9.3 mg/dL (ref 8.4–10.5)
Chloride: 100 mEq/L (ref 96–112)
Creatinine, Ser: 0.89 mg/dL (ref 0.50–1.35)
GFR calc Af Amer: >90 mL/min (ref >90)
GFR calc non Af Amer: 85 mL/min — ABNORMAL LOW (ref >90)
Glucose, Bld: 128 mg/dL — ABNORMAL HIGH (ref 70–99)
Potassium: 4.2 mEq/L (ref 3.7–5.3)
Sodium: 139 mEq/L (ref 137–147)
Total Bilirubin: 0.6 mg/dL (ref 0.3–1.2)
Total Protein: 7.1 g/dL (ref 6.0–8.3)

## 2014-02-02 LAB — CBC WITH DIFFERENTIAL/PLATELET
Basophils Absolute: 0 10*3/uL (ref 0.0–0.1)
Basophils Relative: 0 % (ref 0–1)
Eosinophils Absolute: 0 10*3/uL (ref 0.0–0.7)
Eosinophils Relative: 0 % (ref 0–5)
HCT: 43.9 % (ref 39.0–52.0)
Hemoglobin: 15.6 g/dL (ref 13.0–17.0)
Lymphocytes Relative: 6 % — ABNORMAL LOW (ref 12–46)
Lymphs Abs: 0.5 10*3/uL — ABNORMAL LOW (ref 0.7–4.0)
MCH: 32.4 pg (ref 26.0–34.0)
MCHC: 35.5 g/dL (ref 30.0–36.0)
MCV: 91.3 fL (ref 78.0–100.0)
Monocytes Absolute: 0.4 10*3/uL (ref 0.1–1.0)
Monocytes Relative: 4 % (ref 3–12)
Neutro Abs: 8.2 10*3/uL — ABNORMAL HIGH (ref 1.7–7.7)
Neutrophils Relative %: 90 % — ABNORMAL HIGH (ref 43–77)
Platelets: 268 10*3/uL (ref 150–400)
RBC: 4.81 MIL/uL (ref 4.22–5.81)
RDW: 12.3 % (ref 11.5–15.5)
WBC: 9.1 10*3/uL (ref 4.0–10.5)

## 2014-02-02 LAB — URINALYSIS, ROUTINE W REFLEX MICROSCOPIC
Glucose, UA: NEGATIVE mg/dL
Hgb urine dipstick: NEGATIVE
Ketones, ur: >80 mg/dL — AB
Leukocytes, UA: NEGATIVE
Nitrite: NEGATIVE
Protein, ur: NEGATIVE mg/dL
Specific Gravity, Urine: 1.026 (ref 1.005–1.030)
Urobilinogen, UA: 0.2 mg/dL (ref 0.0–1.0)
pH: 5.5 (ref 5.0–8.0)

## 2014-02-02 LAB — LIPASE, BLOOD: Lipase: 16 U/L (ref 11–59)

## 2014-02-02 SURGERY — APPENDECTOMY, LAPAROSCOPIC
Anesthesia: General | Site: Abdomen

## 2014-02-02 MED ORDER — MIDAZOLAM HCL 2 MG/2ML IJ SOLN
INTRAMUSCULAR | Status: AC
  Filled 2014-02-02: qty 2

## 2014-02-02 MED ORDER — SODIUM CHLORIDE 0.9 % IV SOLN
INTRAVENOUS | Status: DC
  Administered 2014-02-02 (×2): via INTRAVENOUS

## 2014-02-02 MED ORDER — PANTOPRAZOLE SODIUM 40 MG PO TBEC
40.0000 mg | DELAYED_RELEASE_TABLET | Freq: Every day | ORAL | Status: DC
  Administered 2014-02-03: 40 mg via ORAL
  Filled 2014-02-02: qty 1

## 2014-02-02 MED ORDER — ONDANSETRON HCL 4 MG/2ML IJ SOLN
INTRAMUSCULAR | Status: DC | PRN
  Administered 2014-02-02: 4 mg via INTRAVENOUS

## 2014-02-02 MED ORDER — PROPOFOL 10 MG/ML IV BOLUS
INTRAVENOUS | Status: DC | PRN
  Administered 2014-02-02: 180 mg via INTRAVENOUS

## 2014-02-02 MED ORDER — SODIUM CHLORIDE 0.9 % IV SOLN
INTRAVENOUS | Status: DC
  Administered 2014-02-02 – 2014-02-03 (×2): via INTRAVENOUS

## 2014-02-02 MED ORDER — LIDOCAINE HCL (CARDIAC) 20 MG/ML IV SOLN
INTRAVENOUS | Status: DC | PRN
  Administered 2014-02-02: 80 mg via INTRAVENOUS

## 2014-02-02 MED ORDER — HYDROCHLOROTHIAZIDE 25 MG PO TABS
25.0000 mg | ORAL_TABLET | Freq: Every day | ORAL | Status: DC
  Filled 2014-02-02: qty 1

## 2014-02-02 MED ORDER — HYDROMORPHONE HCL PF 1 MG/ML IJ SOLN: 1.0000 mg | Freq: Once | INTRAMUSCULAR | Status: DC

## 2014-02-02 MED ORDER — MIDAZOLAM HCL 5 MG/5ML IJ SOLN
INTRAMUSCULAR | Status: DC | PRN
  Administered 2014-02-02: 2 mg via INTRAVENOUS

## 2014-02-02 MED ORDER — HYDROMORPHONE HCL PF 1 MG/ML IJ SOLN
0.2500 mg | INTRAMUSCULAR | Status: DC | PRN
Start: 2014-02-02 — End: 2014-02-02
  Administered 2014-02-02: 0.5 mg via INTRAVENOUS

## 2014-02-02 MED ORDER — BUPIVACAINE-EPINEPHRINE 0.25% -1:200000 IJ SOLN
INTRAMUSCULAR | Status: DC | PRN
  Administered 2014-02-02: 20 mL

## 2014-02-02 MED ORDER — MORPHINE SULFATE 2 MG/ML IJ SOLN
1.0000 mg | INTRAMUSCULAR | Status: DC | PRN
  Administered 2014-02-02: 2 mg via INTRAVENOUS
  Filled 2014-02-02: qty 1

## 2014-02-02 MED ORDER — DUTASTERIDE 0.5 MG PO CAPS
0.5000 mg | ORAL_CAPSULE | Freq: Every day | ORAL | Status: DC
  Administered 2014-02-02 – 2014-02-03 (×2): 0.5 mg via ORAL
  Filled 2014-02-02 (×3): qty 1

## 2014-02-02 MED ORDER — ARTIFICIAL TEARS OP OINT
TOPICAL_OINTMENT | OPHTHALMIC | Status: DC | PRN
  Administered 2014-02-02: 1 via OPHTHALMIC

## 2014-02-02 MED ORDER — METHYLPREDNISOLONE SODIUM SUCC 125 MG IJ SOLR
125.0000 mg | Freq: Once | INTRAMUSCULAR | Status: AC
  Administered 2014-02-02: 125 mg via INTRAVENOUS
  Filled 2014-02-02: qty 2

## 2014-02-02 MED ORDER — HYDROCODONE-ACETAMINOPHEN 5-325 MG PO TABS: 1.0000 | ORAL_TABLET | ORAL | Status: DC | PRN

## 2014-02-02 MED ORDER — DIPHENHYDRAMINE HCL 50 MG/ML IJ SOLN
50.0000 mg | Freq: Once | INTRAMUSCULAR | Status: AC
  Administered 2014-02-02: 50 mg via INTRAVENOUS
  Filled 2014-02-02: qty 1

## 2014-02-02 MED ORDER — VALSARTAN-HYDROCHLOROTHIAZIDE 160-25 MG PO TABS: 1.0000 | ORAL_TABLET | Freq: Every day | ORAL | Status: DC

## 2014-02-02 MED ORDER — LACTATED RINGERS IV SOLN
INTRAVENOUS | Status: DC | PRN
  Administered 2014-02-02 (×2): via INTRAVENOUS

## 2014-02-02 MED ORDER — FENTANYL CITRATE 0.05 MG/ML IJ SOLN
INTRAMUSCULAR | Status: DC | PRN
  Administered 2014-02-02 (×2): 50 ug via INTRAVENOUS

## 2014-02-02 MED ORDER — SUCCINYLCHOLINE CHLORIDE 20 MG/ML IJ SOLN
INTRAMUSCULAR | Status: DC | PRN
  Administered 2014-02-02: 100 mg via INTRAVENOUS

## 2014-02-02 MED ORDER — BUPIVACAINE-EPINEPHRINE (PF) 0.25% -1:200000 IJ SOLN
INTRAMUSCULAR | Status: AC
  Filled 2014-02-02: qty 30

## 2014-02-02 MED ORDER — PIPERACILLIN-TAZOBACTAM 3.375 G IVPB
3.3750 g | Freq: Three times a day (TID) | INTRAVENOUS | Status: DC
  Administered 2014-02-02 – 2014-02-04 (×5): 3.375 g via INTRAVENOUS
  Filled 2014-02-02 (×8): qty 50

## 2014-02-02 MED ORDER — PROPOFOL 10 MG/ML IV BOLUS
INTRAVENOUS | Status: AC
  Filled 2014-02-02: qty 20

## 2014-02-02 MED ORDER — SODIUM CHLORIDE 0.9 % IV SOLN
INTRAVENOUS | Status: DC
Start: 2014-02-02 — End: 2014-02-02

## 2014-02-02 MED ORDER — OXYCODONE HCL 5 MG/5ML PO SOLN: 5.0000 mg | Freq: Once | ORAL | Status: DC | PRN

## 2014-02-02 MED ORDER — ENOXAPARIN SODIUM 40 MG/0.4ML ~~LOC~~ SOLN: 40.0000 mg | SUBCUTANEOUS | Status: DC

## 2014-02-02 MED ORDER — HYDROMORPHONE HCL PF 1 MG/ML IJ SOLN
INTRAMUSCULAR | Status: AC
  Filled 2014-02-02: qty 1

## 2014-02-02 MED ORDER — IRBESARTAN 150 MG PO TABS
150.0000 mg | ORAL_TABLET | Freq: Every day | ORAL | Status: DC
  Administered 2014-02-03: 150 mg via ORAL
  Filled 2014-02-02 (×2): qty 1

## 2014-02-02 MED ORDER — FENTANYL CITRATE 0.05 MG/ML IJ SOLN
INTRAMUSCULAR | Status: AC
  Filled 2014-02-02: qty 5

## 2014-02-02 MED ORDER — ONDANSETRON HCL 4 MG/2ML IJ SOLN
4.0000 mg | Freq: Once | INTRAMUSCULAR | Status: AC
  Administered 2014-02-02: 4 mg via INTRAVENOUS
  Filled 2014-02-02: qty 2

## 2014-02-02 MED ORDER — ONDANSETRON HCL 4 MG/2ML IJ SOLN: 4.0000 mg | Freq: Four times a day (QID) | INTRAMUSCULAR | Status: DC | PRN

## 2014-02-02 MED ORDER — OXYCODONE HCL 5 MG PO TABS: 5.0000 mg | ORAL_TABLET | Freq: Once | ORAL | Status: DC | PRN

## 2014-02-02 MED ORDER — ACETAMINOPHEN 325 MG PO TABS: 650.0000 mg | ORAL_TABLET | Freq: Four times a day (QID) | ORAL | Status: DC | PRN

## 2014-02-02 MED ORDER — LIDOCAINE HCL (CARDIAC) 20 MG/ML IV SOLN
INTRAVENOUS | Status: AC
  Filled 2014-02-02: qty 5

## 2014-02-02 MED ORDER — IOHEXOL 300 MG/ML  SOLN
100.0000 mL | Freq: Once | INTRAMUSCULAR | Status: AC | PRN
  Administered 2014-02-02: 100 mL via INTRAVENOUS

## 2014-02-02 MED ORDER — MORPHINE SULFATE 2 MG/ML IJ SOLN: 2.0000 mg | INTRAMUSCULAR | Status: DC | PRN

## 2014-02-02 MED ORDER — SODIUM CHLORIDE 0.9 % IR SOLN
Status: DC | PRN
  Administered 2014-02-02: 1000 mL

## 2014-02-02 MED ORDER — MORPHINE SULFATE 4 MG/ML IJ SOLN
4.0000 mg | Freq: Once | INTRAMUSCULAR | Status: AC
  Administered 2014-02-02: 4 mg via INTRAVENOUS
  Filled 2014-02-02: qty 1

## 2014-02-02 MED ORDER — PIPERACILLIN-TAZOBACTAM 3.375 G IVPB 30 MIN
3.3750 g | Freq: Once | INTRAVENOUS | Status: AC
  Administered 2014-02-02: 3.375 g via INTRAVENOUS
  Filled 2014-02-02: qty 50

## 2014-02-02 MED ORDER — ACETAMINOPHEN 650 MG RE SUPP: 650.0000 mg | Freq: Four times a day (QID) | RECTAL | Status: DC | PRN

## 2014-02-02 MED ORDER — OMEPRAZOLE MAGNESIUM 20 MG PO TBEC: 20.0000 mg | DELAYED_RELEASE_TABLET | Freq: Every day | ORAL | Status: DC

## 2014-02-02 MED ORDER — ASPIRIN EC 81 MG PO TBEC
81.0000 mg | DELAYED_RELEASE_TABLET | Freq: Every day | ORAL | Status: DC
  Administered 2014-02-03: 81 mg via ORAL
  Filled 2014-02-02 (×2): qty 1

## 2014-02-02 MED ORDER — LATANOPROST 0.005 % OP SOLN
1.0000 [drp] | Freq: Every day | OPHTHALMIC | Status: DC
  Administered 2014-02-02 – 2014-02-03 (×2): 1 [drp] via OPHTHALMIC
  Filled 2014-02-02: qty 2.5

## 2014-02-02 SURGICAL SUPPLY — 43 items
APPLIER CLIP LOGIC TI 5 (MISCELLANEOUS) IMPLANT
APPLIER CLIP ROT 10 11.4 M/L (STAPLE)
BANDAGE ADHESIVE 1X3 (GAUZE/BANDAGES/DRESSINGS) ×9 IMPLANT
BENZOIN TINCTURE PRP APPL 2/3 (GAUZE/BANDAGES/DRESSINGS) ×3 IMPLANT
BLADE 10 SAFETY STRL DISP (BLADE) ×3 IMPLANT
BNDG COHESIVE 3X5 WHT NS (GAUZE/BANDAGES/DRESSINGS) ×3 IMPLANT
CANISTER SUCTION 2500CC (MISCELLANEOUS) ×3 IMPLANT
CHLORAPREP W/TINT 26ML (MISCELLANEOUS) ×3 IMPLANT
CLIP APPLIE ROT 10 11.4 M/L (STAPLE) IMPLANT
CLOSURE STERI-STRIP 1/2X4 (GAUZE/BANDAGES/DRESSINGS) ×1
CLSR STERI-STRIP ANTIMIC 1/2X4 (GAUZE/BANDAGES/DRESSINGS) ×2 IMPLANT
COVER SURGICAL LIGHT HANDLE (MISCELLANEOUS) ×3 IMPLANT
CUTTER LINEAR ENDO 35 ETS (STAPLE) ×3 IMPLANT
CUTTER LINEAR ENDO 35 ETS TH (STAPLE) IMPLANT
DECANTER SPIKE VIAL GLASS SM (MISCELLANEOUS) ×3 IMPLANT
DRAPE UTILITY 15X26 W/TAPE STR (DRAPE) ×6 IMPLANT
ELECT REM PT RETURN 9FT ADLT (ELECTROSURGICAL) ×3
ELECTRODE REM PT RTRN 9FT ADLT (ELECTROSURGICAL) ×1 IMPLANT
ENDOLOOP SUT PDS II  0 18 (SUTURE)
ENDOLOOP SUT PDS II 0 18 (SUTURE) IMPLANT
GLOVE SURG SIGNA 7.5 PF LTX (GLOVE) ×3 IMPLANT
GOWN STRL REUS W/ TWL LRG LVL3 (GOWN DISPOSABLE) ×1 IMPLANT
GOWN STRL REUS W/ TWL XL LVL3 (GOWN DISPOSABLE) ×1 IMPLANT
GOWN STRL REUS W/TWL LRG LVL3 (GOWN DISPOSABLE) ×2
GOWN STRL REUS W/TWL XL LVL3 (GOWN DISPOSABLE) ×2
KIT BASIN OR (CUSTOM PROCEDURE TRAY) ×3 IMPLANT
KIT ROOM TURNOVER OR (KITS) ×3 IMPLANT
NS IRRIG 1000ML POUR BTL (IV SOLUTION) ×3 IMPLANT
PAD ARMBOARD 7.5X6 YLW CONV (MISCELLANEOUS) ×6 IMPLANT
POUCH SPECIMEN RETRIEVAL 10MM (ENDOMECHANICALS) ×3 IMPLANT
RELOAD /EVU35 (ENDOMECHANICALS) IMPLANT
RELOAD CUTTER ETS 35MM STAND (ENDOMECHANICALS) IMPLANT
SCALPEL HARMONIC ACE (MISCELLANEOUS) ×3 IMPLANT
SET IRRIG TUBING LAPAROSCOPIC (IRRIGATION / IRRIGATOR) ×3 IMPLANT
SLEEVE ENDOPATH XCEL 5M (ENDOMECHANICALS) ×3 IMPLANT
SPECIMEN JAR SMALL (MISCELLANEOUS) ×3 IMPLANT
SUT MON AB 4-0 PC3 18 (SUTURE) ×3 IMPLANT
TOWEL OR 17X24 6PK STRL BLUE (TOWEL DISPOSABLE) ×3 IMPLANT
TOWEL OR 17X26 10 PK STRL BLUE (TOWEL DISPOSABLE) ×3 IMPLANT
TRAY LAPAROSCOPIC (CUSTOM PROCEDURE TRAY) ×3 IMPLANT
TROCAR XCEL BLUNT TIP 100MML (ENDOMECHANICALS) ×3 IMPLANT
TROCAR XCEL NON-BLD 5MMX100MML (ENDOMECHANICALS) ×3 IMPLANT
WATER STERILE IRR 1000ML POUR (IV SOLUTION) IMPLANT

## 2014-02-02 NOTE — H&P (Signed)
Chief Complaint: abdominal pain, acute appendicitis  HPI: Clinton Fuller is as 73 year old male with a history of hypertension presenting with RLQ abdominal pain.  Duration of symptoms; since early this morning.  Onset was sudden.  Coarse is unchanged.  Moderate in severity.  Time pattern is constant.  Location is RLQ with radiation to RUQ and LLQ.  Associated with nausea and chills.  Previous symptoms back in January at which time he was admitted to our service with acute appendicitis with local perforation and treated medically.  He has been doing well until this AM.  Denies fevers, diarrhea or anorexia.  Last oral intake was last night.  Uses baby ASA once daily.  No other anticoagulants.    Past Medical History  Diagnosis Date  . Hypertension   . Depression   . BPH (benign prostatic hyperplasia)   . Family history of anesthesia complication     "~ 2951'O? mother had a reaction they think to lidocaine maybe; throat swelled up; had to put her on vent" (10/25/2013)  . GERD (gastroesophageal reflux disease)   . Arthritis     "hips, hands" (10/25/2013)    Past Surgical History  Procedure Laterality Date  . Tonsillectomy  ~ 1950  . Mole removal      "had and body"   . Lipoma excision      "off my back"    Family History  Problem Relation Age of Onset  . Heart disease Father    Social History:  reports that he quit smoking about 25 years ago. His smoking use included Cigarettes. He has a 10 pack-year smoking history. He has never used smokeless tobacco. He reports that he drinks alcohol. He reports that he does not use illicit drugs.  Allergies:  Allergies  Allergen Reactions  . Iodine Hives  . Shellfish Allergy Nausea And Vomiting  . Sulfa Antibiotics Other (See Comments)    Unknown childhood reaction     (Not in a hospital admission)  Results for orders placed during the hospital encounter of 02/02/14 (from the past 48 hour(s))  CBC WITH DIFFERENTIAL     Status: Abnormal    Collection Time    02/02/14 10:20 AM      Result Value Ref Range   WBC 9.1  4.0 - 10.5 K/uL   RBC 4.81  4.22 - 5.81 MIL/uL   Hemoglobin 15.6  13.0 - 17.0 g/dL   HCT 43.9  39.0 - 52.0 %   MCV 91.3  78.0 - 100.0 fL   MCH 32.4  26.0 - 34.0 pg   MCHC 35.5  30.0 - 36.0 g/dL   RDW 12.3  11.5 - 15.5 %   Platelets 268  150 - 400 K/uL   Neutrophils Relative % 90 (*) 43 - 77 %   Neutro Abs 8.2 (*) 1.7 - 7.7 K/uL   Lymphocytes Relative 6 (*) 12 - 46 %   Lymphs Abs 0.5 (*) 0.7 - 4.0 K/uL   Monocytes Relative 4  3 - 12 %   Monocytes Absolute 0.4  0.1 - 1.0 K/uL   Eosinophils Relative 0  0 - 5 %   Eosinophils Absolute 0.0  0.0 - 0.7 K/uL   Basophils Relative 0  0 - 1 %   Basophils Absolute 0.0  0.0 - 0.1 K/uL  COMPREHENSIVE METABOLIC PANEL     Status: Abnormal   Collection Time    02/02/14 10:20 AM      Result Value Ref Range  Sodium 139  137 - 147 mEq/L   Potassium 4.2  3.7 - 5.3 mEq/L   Chloride 100  96 - 112 mEq/L   CO2 22  19 - 32 mEq/L   Glucose, Bld 128 (*) 70 - 99 mg/dL   BUN 20  6 - 23 mg/dL   Creatinine, Ser 0.89  0.50 - 1.35 mg/dL   Calcium 9.3  8.4 - 10.5 mg/dL   Total Protein 7.1  6.0 - 8.3 g/dL   Albumin 4.0  3.5 - 5.2 g/dL   AST 25  0 - 37 U/L   ALT 29  0 - 53 U/L   Alkaline Phosphatase 49  39 - 117 U/L   Total Bilirubin 0.6  0.3 - 1.2 mg/dL   GFR calc non Af Amer 85 (*) >90 mL/min   GFR calc Af Amer >90  >90 mL/min   Comment: (NOTE)     The eGFR has been calculated using the CKD EPI equation.     This calculation has not been validated in all clinical situations.     eGFR's persistently <90 mL/min signify possible Chronic Kidney     Disease.  LIPASE, BLOOD     Status: None   Collection Time    02/02/14 10:20 AM      Result Value Ref Range   Lipase 16  11 - 59 U/L  URINALYSIS, ROUTINE W REFLEX MICROSCOPIC     Status: Abnormal   Collection Time    02/02/14 12:04 PM      Result Value Ref Range   Color, Urine YELLOW  YELLOW   APPearance CLEAR  CLEAR   Specific  Gravity, Urine 1.026  1.005 - 1.030   pH 5.5  5.0 - 8.0   Glucose, UA NEGATIVE  NEGATIVE mg/dL   Hgb urine dipstick NEGATIVE  NEGATIVE   Bilirubin Urine SMALL (*) NEGATIVE   Ketones, ur >80 (*) NEGATIVE mg/dL   Protein, ur NEGATIVE  NEGATIVE mg/dL   Urobilinogen, UA 0.2  0.0 - 1.0 mg/dL   Nitrite NEGATIVE  NEGATIVE   Leukocytes, UA NEGATIVE  NEGATIVE   Comment: MICROSCOPIC NOT DONE ON URINES WITH NEGATIVE PROTEIN, BLOOD, LEUKOCYTES, NITRITE, OR GLUCOSE <1000 mg/dL.   Ct Abdomen Pelvis W Contrast  02/02/2014   CLINICAL DATA:  Right lower quadrant pain. History of appendicitis in January treated with antibiotics.  EXAM: CT ABDOMEN AND PELVIS WITH CONTRAST  TECHNIQUE: Multidetector CT imaging of the abdomen and pelvis was performed using the standard protocol following bolus administration of intravenous contrast.  CONTRAST:  177m OMNIPAQUE IOHEXOL 300 MG/ML  SOLN  COMPARISON:  CT abdomen 10/25/2013, 11/23/2013  FINDINGS: Findings compatible with acute appendicitis. There is appendiceal thickening and periappendiceal edema. The appearance is similar to the CT of 10/25/2013. This resolved and the appendix was normal on 11/23/2013. Negative for appendicolith. No abscess or free fluid.  Negative for bowel obstruction. 15 mm hypodensity in the lateral segment left lobe liver is unchanged and may represent focal fatty infiltration. This is nonspecific. No other liver lesions. Gallbladder and bile ducts are normal. Pancreas spleen and kidneys are normal.  Negative for mass or adenopathy. Prostate enlargement. No acute spinal abnormality. Mild degenerative change.  IMPRESSION: Findings consistent with acute appendicitis without abscess. Note the patient had appendicitis 10/25/2013 which resolved on antibiotics and has recurred.   Electronically Signed   By: CFranchot GalloM.D.   On: 02/02/2014 13:23   Dg Chest Portable 1 View  02/02/2014   CLINICAL DATA:  Abdominal pain.  Rule out free air.  EXAM: PORTABLE  CHEST - 1 VIEW  COMPARISON:  10/25/2013  FINDINGS: Hypoventilation with mild bibasilar atelectasis. Negative for infiltrate or edema. No effusion. No free air identified, however this is a portable semi upright view which could miss free air. If there is further concern, two view of the abdomen is suggested.  IMPRESSION: Hypoventilation with mild bibasilar atelectasis.   Electronically Signed   By: Franchot Gallo M.D.   On: 02/02/2014 10:29    Review of Systems  All other systems reviewed and are negative.   Blood pressure 111/47, pulse 90, temperature 98 F (36.7 C), temperature source Oral, resp. rate 16, height _0  (1.676 m), weight 220 lb (99.791 kg), SpO2 95.00%. Physical Exam  Constitutional: He is oriented to person, place, and time. He appears well-developed and well-nourished. No distress.  HENT:  Head: Normocephalic and atraumatic.  Mouth/Throat: No oropharyngeal exudate.  Neck: Normal range of motion. Neck supple.  Cardiovascular: Normal rate, regular rhythm, normal heart sounds and intact distal pulses.  Exam reveals no gallop and no friction rub.   No murmur heard. Respiratory: Effort normal and breath sounds normal. No respiratory distress. He has no wheezes. He has no rales. He exhibits no tenderness.  GI: Soft. Bowel sounds are normal. He exhibits no mass. There is no rebound.  ttp to RLQ with voluntary guarding.  Mild ttp to ruq and llq.    Musculoskeletal: Normal range of motion. He exhibits no edema and no tenderness.  Neurological: He is alert and oriented to person, place, and time.  Skin: Skin is warm and dry. No rash noted. He is not diaphoretic. No erythema. No pallor.  Psychiatric: He has a normal mood and affect. His behavior is normal. Judgment and thought content normal.     Assessment/Plan Acute appendicitis -admit for a laparoscopic appendectomy today -obtain consent -NPO, IVF, pain control -SCD/lovenox -continue with home meds  Daun Rens  ANP-BC 02/02/2014, 2:57 PM

## 2014-02-02 NOTE — Transfer of Care (Signed)
Immediate Anesthesia Transfer of Care Note  Patient: Clinton Fuller  Procedure(s) Performed: Procedure(s): APPENDECTOMY LAPAROSCOPIC (N/A)  Patient Location: PACU  Anesthesia Type:General  Level of Consciousness: awake, sedated and patient cooperative  Airway & Oxygen Therapy: Patient Spontanous Breathing and Patient connected to face mask oxygen  Post-op Assessment: Report given to PACU RN, Post -op Vital signs reviewed and stable and Patient moving all extremities  Post vital signs: Reviewed and stable  Complications: No apparent anesthesia complications

## 2014-02-02 NOTE — ED Notes (Signed)
Patient complaint of right lower quadrant pain starting at approximately 0300 this morning.  Patient states he had appendicitis back in January which was treated with antibiotics.  Patient reports slight nausea but no vomiting or diarrhea.

## 2014-02-02 NOTE — ED Notes (Signed)
Patient transported to CT 

## 2014-02-02 NOTE — Anesthesia Procedure Notes (Signed)
Procedure Name: Intubation Date/Time: 02/02/2014 4:22 PM Performed by: Angelica PouSMITH, Mike Berntsen PIZZICARA Pre-anesthesia Checklist: Patient identified, Patient being monitored, Emergency Drugs available, Timeout performed and Suction available Patient Re-evaluated:Patient Re-evaluated prior to inductionOxygen Delivery Method: Circle system utilized Preoxygenation: Pre-oxygenation with 100% oxygen Intubation Type: IV induction, Rapid sequence and Cricoid Pressure applied Laryngoscope Size: Mac and 4 Grade View: Grade I Tube type: Oral Tube size: 7.5 mm Number of attempts: 1 Airway Equipment and Method: Stylet Placement Confirmation: ETT inserted through vocal cords under direct vision,  breath sounds checked- equal and bilateral and positive ETCO2 Secured at: 23 cm Tube secured with: Tape Dental Injury: Teeth and Oropharynx as per pre-operative assessment

## 2014-02-02 NOTE — ED Provider Notes (Signed)
CSN: [id number]     Arrival date & time 02/02/14  [id number] History   First MD Initiated Contact with Patient 02/02/14 [phone]     Chief Complaint  Patient presents with  . Abdominal Pain     (Consider location/radiation/quality/duration/timing/severity/associated sxs/prior Treatment) HPI  73 year old male with abdominal pain. Onset this morning while laying in bed. Went to bed in his usual state of health. Worsening pain throughout the morning subsided in the emergency room. Recent past history significant for appendicitis this past January which is medically treated. Symptoms improved a followup CT showed resolution of inflammatory changes. Patient states that current pain is more diffuse and was then. Nausea, no vomiting. No urinary complaints. No diarrhea. No fevers or chills. No intervention prior to arrival.  Past Medical History  Diagnosis Date  . Hypertension   . Depression   . BPH (benign prostatic hyperplasia)   . Family history of anesthesia complication     "~ 1980's? mother had a reaction they think to lidocaine maybe; throat swelled up; had to put her on vent" (10/25/2013)  . GERD (gastroesophageal reflux disease)   . Arthritis     "hips, hands" (10/25/2013)   Past Surgical History  Procedure Laterality Date  . Tonsillectomy  ~ 1950  . Mole removal      "had and body"   . Lipoma excision      "off my back"   Family History  Problem Relation Age of Onset  . Heart disease Father    History  Substance Use Topics  . Smoking status: Former Smoker -- 1.00 packs/day for 10 years    Types: Cigarettes    Quit date: 11/03/1988  . Smokeless tobacco: Never Used     Comment: 10/25/2013 "stopped smoking cigarettes in the 1980's"  . Alcohol Use: Yes     Comment: states he hasn't drank in the last month    Review of Systems  All systems reviewed and negative, other than as noted in HPI.   Allergies  Iodine; Shellfish allergy; and Sulfa antibiotics  Home Medications    Prior to Admission medications   Medication Sig Start Date End Date Taking? Authorizing Provider  aspirin EC 81 MG tablet Take 81 mg by mouth daily after lunch.   Yes Historical Provider, MD  AVODART 0.5 MG capsule Take 0.5 mg by mouth daily after lunch.  10/17/13  Yes Historical Provider, MD  latanoprost (XALATAN) 0.005 % ophthalmic solution Place 1 drop into both eyes at bedtime.  10/24/13  Yes Historical Provider, MD  omeprazole (PRILOSEC OTC) 20 MG tablet Take 20 mg by mouth daily after lunch.   Yes Historical Provider, MD  valsartan-hydrochlorothiazide (DIOVAN-HCT) 160-25 MG per tablet Take 1 tablet by mouth daily after lunch.  10/18/13  Yes Historical Provider, MD   BP 129/50  Pulse 72  Temp(Src) 98 F (36.7 C) (Oral)  Resp 16  Ht 5\' 6"  (1.676 m)  Wt 220 lb (99.791 kg)  BMI 35.53 kg/m2  SpO2 99% Physical Exam  Nursing note and vitals reviewed. Constitutional: He appears well-developed and well-nourished. No distress.  HENT:  Head: Normocephalic and atraumatic.  Eyes: Conjunctivae are normal. Right eye exhibits no discharge. Left eye exhibits no discharge.  Neck: Neck supple.  Cardiovascular: Normal rate, regular rhythm and normal heart sounds.  Exam reveals no gallop and no friction rub.   No murmur heard. Pulmonary/Chest: Effort normal and breath sounds normal. No respiratory distress.  Abdominal: Soft. He exhibits no distension. There is tenderness. There  is guarding. There is no rebound.  Diffuse abdominal tenderness, perhaps somewhat worse on right side. Voluntary guarding. No guarding. No distention.  Musculoskeletal: He exhibits no edema and no tenderness.  Neurological: He is alert.  Skin: Skin is warm and dry.  Psychiatric: He has a normal mood and affect. His behavior is normal. Thought content normal.    ED Course  Procedures (including critical care time) Labs Review Labs Reviewed  CBC WITH DIFFERENTIAL - Abnormal; Notable for the following:    Neutrophils  Relative % 90 (*)    Neutro Abs 8.2 (*)    Lymphocytes Relative 6 (*)    Lymphs Abs 0.5 (*)    All other components within normal limits  COMPREHENSIVE METABOLIC PANEL - Abnormal; Notable for the following:    Glucose, Bld 128 (*)    GFR calc non Af Amer 85 (*)    All other components within normal limits  URINALYSIS, ROUTINE W REFLEX MICROSCOPIC - Abnormal; Notable for the following:    Bilirubin Urine SMALL (*)    Ketones, ur >80 (*)    All other components within normal limits  LIPASE, BLOOD    Imaging Review No results found.   EKG Interpretation None      MDM   Final diagnoses:  Appendicitis    73 year old male with abdominal pain. Patient with a pertinent past history of appendicitis which was treated medically this past January. Clinically improved and follow-up CT showed improvement as well. His pain today is different and is much more diffuse. He is significantly tender on exam and diffusely. On review of prior records, I did not get the impression that his was tender as he is today. No distension. Unfortunately, he has a IV contrast allergy. He has tolerated subsequent CTs, although with pain medication. We'll start this process.  Labs and urinalysis. IV access and pain medication.  CT does again show appendicitis. Abx. Surgical consultation.     Raeford RazorStephen Zenas Santa, MD 02/06/14 [phone]

## 2014-02-02 NOTE — Anesthesia Preprocedure Evaluation (Addendum)
Anesthesia Evaluation  Patient identified by MRN, date of birth, ID band Patient awake    Reviewed: Allergy & Precautions, H&P , NPO status , Patient's Chart, lab work & pertinent test results  History of Anesthesia Complications Negative for: history of anesthetic complications  Airway Mallampati: II TM Distance: >3 FB Neck ROM: Full    Dental  (+) Teeth Intact, Caps, Dental Advisory Given   Pulmonary former smoker,  breath sounds clear to auscultation  Pulmonary exam normal       Cardiovascular Exercise Tolerance: Good hypertension, Pt. on medications Rhythm:Regular Rate:Normal     Neuro/Psych    GI/Hepatic Neg liver ROS, GERD-  Medicated and Controlled,  Endo/Other  negative endocrine ROS  Renal/GU negative Renal ROS     Musculoskeletal   Abdominal   Peds  Hematology   Anesthesia Other Findings   Reproductive/Obstetrics                          Anesthesia Physical Anesthesia Plan  ASA: II  Anesthesia Plan: General   Post-op Pain Management:    Induction: Intravenous and Rapid sequence  Airway Management Planned: Oral ETT  Additional Equipment:   Intra-op Plan:   Post-operative Plan: Extubation in OR  Informed Consent: I have reviewed the patients History and Physical, chart, labs and discussed the procedure including the risks, benefits and alternatives for the proposed anesthesia with the patient or authorized representative who has indicated his/her understanding and acceptance.   Dental advisory given  Plan Discussed with: CRNA, Anesthesiologist and Surgeon  Anesthesia Plan Comments:         Anesthesia Quick Evaluation

## 2014-02-02 NOTE — Progress Notes (Signed)
Patient ID: Clinton RoughenDavid Fuller, male   DOB: [birth year], [age] y.o.   MRN: [id number]   Seen and Agree  Recurrent appendicitis  Plan laparoscopic appendectomy. I discussed the risks which include but are not limited to bleeding, infection, stump leak, injury to surrounding structures, need for further surgery, cardiopulmonary problems, etc.  He agrees to proceed.

## 2014-02-02 NOTE — H&P (Signed)
I have seen and examined the patient and agree with the assessment and plans.  Clinton Fuller A. Kaylina Cahue  MD, FACS  

## 2014-02-02 NOTE — Op Note (Signed)
APPENDECTOMY LAPAROSCOPIC  Procedure Note  Clinton RoughenDavid Fuller 02/02/2014   Pre-op Diagnosis: acute appendicitis     Post-op Diagnosis: same  Procedure(s): APPENDECTOMY LAPAROSCOPIC  Surgeon(s): Clinton Rubensteinouglas A Mary-Anne Polizzi, MD  Anesthesia: General  Staff:  Circulator: Harvie BridgeJamie C Sult, RN Scrub Person: Maureen RalphsSheila C Bowman, RN  Estimated Blood Loss: Minimal               Specimens: sent to path          Clinton Fuller   Date: 02/02/2014  Time: 5:01 PM

## 2014-02-03 MED ORDER — HYDROCHLOROTHIAZIDE 25 MG PO TABS
25.0000 mg | ORAL_TABLET | Freq: Every day | ORAL | Status: DC
  Administered 2014-02-03: 25 mg via ORAL
  Filled 2014-02-03: qty 1

## 2014-02-03 MED ORDER — OXYCODONE HCL 5 MG PO TABS
5.0000 mg | ORAL_TABLET | ORAL | Status: DC | PRN
  Administered 2014-02-03 – 2014-02-04 (×4): 10 mg via ORAL
  Filled 2014-02-03 (×4): qty 2

## 2014-02-03 MED ORDER — VALSARTAN-HYDROCHLOROTHIAZIDE 160-25 MG PO TABS: 1.0000 | ORAL_TABLET | Freq: Every day | ORAL | Status: DC

## 2014-02-03 MED ORDER — DIPHENHYDRAMINE HCL 50 MG/ML IJ SOLN: 12.5000 mg | Freq: Four times a day (QID) | INTRAMUSCULAR | Status: DC | PRN

## 2014-02-03 MED ORDER — METOPROLOL TARTRATE 12.5 MG HALF TABLET: 12.5000 mg | ORAL_TABLET | Freq: Two times a day (BID) | ORAL | Status: DC | PRN

## 2014-02-03 MED ORDER — IRBESARTAN 150 MG PO TABS
150.0000 mg | ORAL_TABLET | Freq: Every day | ORAL | Status: DC
Start: 2014-02-03 — End: 2014-02-04
  Filled 2014-02-03 (×2): qty 1

## 2014-02-03 MED ORDER — SODIUM CHLORIDE 0.9 % IJ SOLN: 3.0000 mL | INTRAMUSCULAR | Status: DC | PRN

## 2014-02-03 MED ORDER — ACETAMINOPHEN 500 MG PO TABS
1000.0000 mg | ORAL_TABLET | Freq: Three times a day (TID) | ORAL | Status: DC
  Administered 2014-02-03 – 2014-02-04 (×4): 1000 mg via ORAL
  Filled 2014-02-03 (×7): qty 2

## 2014-02-03 MED ORDER — MAGIC MOUTHWASH
15.0000 mL | Freq: Four times a day (QID) | ORAL | Status: DC | PRN
Start: 2014-02-03 — End: 2014-02-04

## 2014-02-03 MED ORDER — BISACODYL 10 MG RE SUPP
10.0000 mg | Freq: Two times a day (BID) | RECTAL | Status: DC | PRN
  Administered 2014-02-04: 10 mg via RECTAL
  Filled 2014-02-03: qty 1

## 2014-02-03 MED ORDER — SODIUM CHLORIDE 0.9 % IJ SOLN: 3.0000 mL | Freq: Two times a day (BID) | INTRAMUSCULAR | Status: DC

## 2014-02-03 MED ORDER — LIP MEDEX EX OINT
1.0000 "application " | TOPICAL_OINTMENT | Freq: Two times a day (BID) | CUTANEOUS | Status: DC
  Filled 2014-02-03: qty 7

## 2014-02-03 MED ORDER — ALUM & MAG HYDROXIDE-SIMETH 200-200-20 MG/5ML PO SUSP: 30.0000 mL | Freq: Four times a day (QID) | ORAL | Status: DC | PRN

## 2014-02-03 MED ORDER — BLISTEX MEDICATED EX OINT
TOPICAL_OINTMENT | Freq: Two times a day (BID) | CUTANEOUS | Status: DC
  Filled 2014-02-03: qty 10

## 2014-02-03 MED ORDER — METOPROLOL TARTRATE 1 MG/ML IV SOLN: 5.0000 mg | Freq: Four times a day (QID) | INTRAVENOUS | Status: DC | PRN

## 2014-02-03 MED ORDER — MAGNESIUM HYDROXIDE 400 MG/5ML PO SUSP: 30.0000 mL | Freq: Two times a day (BID) | ORAL | Status: DC | PRN

## 2014-02-03 MED ORDER — PHENOL 1.4 % MT LIQD: 2.0000 | OROMUCOSAL | Status: DC | PRN

## 2014-02-03 MED ORDER — LACTATED RINGERS IV BOLUS (SEPSIS): 1000.0000 mL | Freq: Three times a day (TID) | INTRAVENOUS | Status: DC | PRN

## 2014-02-03 MED ORDER — MENTHOL 3 MG MT LOZG: 1.0000 | LOZENGE | OROMUCOSAL | Status: DC | PRN

## 2014-02-03 NOTE — Op Note (Signed)
NAMSteva Ready:  Fuller, Clinton             ACCOUNT NO.:  [id number]  MEDICAL RECORD NO.:  [id number]  LOCATION:  [id number]                        FACILITY:  [facility]  PHYSICIAN:  Abigail Miyamotoouglas Charrise Lardner, M.D. DATE OF BIRTH:  [birth year]  DATE OF PROCEDURE:  02/02/2014 DATE OF DISCHARGE:                              OPERATIVE REPORT   PREOPERATIVE DIAGNOSIS:  Acute appendicitis.  POSTOPERATIVE DIAGNOSIS:  Acute appendicitis.  PROCEDURE:  Laparoscopic appendectomy.  SURGEON:  Abigail Miyamotoouglas Kamrie Fanton, M.D.  ANESTHESIA:  General and 0.5% Marcaine with epinephrine.  ESTIMATED BLOOD LOSS:  Minimal.  INDICATIONS:  This is a 73 year old gentleman, who actually presented in January 2015 with appendicitis.  He was treated conservatively with IV antibiotics and then transitioned to oral antibiotics and did well.  He had a follow up CAT scan showing resolution.  He presented today with recurrence of right lower quadrant abdominal pain and a CAT scan again showed appendicitis.  Decision was made to proceed to the operating room.  FINDINGS:  The patient was found to have a contracted appendix.  There was a moderate amount of fibrinous exudate in the right lower quadrant, but no gross abscess or signs of perforation.  PROCEDURE IN DETAIL:  The patient was brought to the operative room and identified as Clinton Fuller.  He was placed supine on the operating room table and general anesthesia was induced.  His abdomen was then prepped and draped in usual sterile fashion.  Using a 15 blade, I made a small incision just below the umbilicus.  I carried this down to fascia, which was then opened with a scalpel.  Hemostat was then used to pass into the peritoneal cavity under direct vision.  A 0 Vicryl pursestring suture was placed around the fascial opening.  The Hasson port was placed through the opening and insufflation of the abdomen was begun.  I placed another 5-mm trocar in the right upper quadrant and another  in the left lower quadrant both under direct vision.  The cecum was found to be inflamed with fibrinous exudate on the cecum and on the peritoneal sidewall.  I then pulled the cecum off the side of the pelvis.  The appendix was retrocecal with slight part of it stuck to the abdominal wall.  This pulled apart when I was dissecting free the appendix.  The appendix itself was quite shriveled up but the base was viable.  I was able to dissect out the base and transected with endoscopic GIA stapler. I then took down the mesoappendix with the Harmonic Scalpel.  The piece of the appendix was then placed in an Endosac and removed through the incision at the umbilicus.  I again evaluated the colon and found no evidence of injury or any other abnormalities.  At this point, I thoroughly irrigated the right lower quadrant with a liter of normal saline.  Again, hemostasis appeared to be achieved.  All ports removed under direct vision.  The abdomen was deflated.  The 0 Vicryl at the umbilicus was tied in place closing the fascial defect.  All incisions were then anesthetized with Marcaine and closed with 4-0 Monocryl subcuticular sutures.  Steri-Strips and Band-Aids were then applied.  The patient tolerated the procedure  well.  All the counts were correct at the end of the procedure.  The patient was then extubated in the operating room and taken in stable condition to recovery room.     Abigail Miyamotoouglas Labresha Mellor, M.D.     DB/MEDQ  D:  02/02/2014  T:  02/03/2014  Job:  [id number]

## 2014-02-03 NOTE — Progress Notes (Signed)
[place], MD, [address] [place], [place] [address] Phone: [phone] FAX: [phone]    Clinton Fuller [id number] August 25, 1941  CARE TEAM:  PCP: Kandice Hams, MD  Outpatient Care Team: Patient Care Team: Kandice Hams, MD as PCP - General (Internal Medicine)  Inpatient Treatment Team: Treatment Team: Attending Provider: Md Edison Pace, MD; Consulting Physician: Md Edison Pace, MD; Registered Nurse: Kennith Center, RN; Technician: Roger Shelter, NT; Registered Nurse: Dagoberto Ligas, RN; Technician: Deedra Ehrich, NT   Subjective:  Sore Tol clears Not walking much Feels bloated  Objective:  Vital signs:  Filed Vitals:   02/02/14 1846 02/02/14 2200 02/03/14 0132 02/03/14 0547  BP: 125/59 109/56 108/54 115/53  Pulse: 83 83 83 82  Temp: 99 F (37.2 C) 99.1 F (37.3 C) 98.3 F (36.8 C) 98.7 F (37.1 C)  TempSrc: Oral Oral Oral Oral  Resp: '18 17 16 16  ' Height: '5\' 6"'  (1.676 m)     Weight: 220 lb (99.791 kg)     SpO2: 95% 96% 93% 95%    Last BM Date: 02/01/14  Intake/Output   Yesterday:  04/24 0701 - 04/25 0700 In: 2690 [P.O.:820; I.V.:1870] Out: 1200 [Urine:1200] This shift:     Bowel function:  Flatus: n  BM: n  Drain: n/a  Physical Exam:  General: Pt awake/alert/oriented x4 in no acute distress Eyes: PERRL, normal EOM.  Sclera clear.  No icterus Neuro: CN II-XII intact w/o focal sensory/motor deficits. Lymph: No head/neck/groin lymphadenopathy Psych:  No delerium/psychosis/paranoia HENT: Normocephalic, Mucus membranes moist.  No thrush Neck: Supple, No tracheal deviation Chest: No chest wall pain w good excursion CV:  Pulses intact.  Regular rhythm MS: Normal AROM mjr joints.  No obvious deformity Abdomen: Soft.  Obese.  Mild/mod distended.  Mildly tender at incisions only.  No evidence of peritonitis.  No incarcerated hernias. Ext:  SCDs BLE.  No mjr edema.  No  cyanosis Skin: No petechiae / purpura   Problem List:   Active Problems:   Acute appendicitis   Assessment  Clinton Fuller  73 y.o. male  1 Day Post-Op  Procedure(s): POSTOPERATIVE DIAGNOSIS: Acute appendicitis.  PROCEDURE: Laparoscopic appendectomy.  SURGEON: Coralie Keens, M.D.    OK but not rready for d/c  Plan:  -get up more -try adv diet if better -bowel regimen -cont IV ABx -VTE prophylaxis- SCDs, etc -mobilize as tolerated to help recovery  D/C patient from hospital when patient meets criteria (anticipate in 1-2 day(s)):  Tolerating oral intake well Ambulating in walkways Adequate pain control without IV medications Urinating  Having flatus  I updated the patient's status to the patient.  Recommendations were made.  Questions were answered.  The patient expressed understanding & appreciation.   Adin Hector, M.D., F.A.C.S. Gastrointestinal and Minimally Invasive Surgery [facility] [address] [place], [place] [address] [phone] Main / Paging   02/03/2014   Results:   Labs: Results for orders placed during the hospital encounter of 02/02/14 (from the past 48 hour(s))  CBC WITH DIFFERENTIAL     Status: Abnormal   Collection Time    02/02/14 10:20 AM      Result Value Ref Range   WBC 9.1  4.0 - 10.5 K/uL   RBC 4.81  4.22 - 5.81 MIL/uL   Hemoglobin 15.6  13.0 - 17.0 g/dL   HCT 43.9  39.0 - 52.0 %  MCV 91.3  78.0 - 100.0 fL   MCH 32.4  26.0 - 34.0 pg   MCHC 35.5  30.0 - 36.0 g/dL   RDW 12.3  11.5 - 15.5 %   Platelets 268  150 - 400 K/uL   Neutrophils Relative % 90 (*) 43 - 77 %   Neutro Abs 8.2 (*) 1.7 - 7.7 K/uL   Lymphocytes Relative 6 (*) 12 - 46 %   Lymphs Abs 0.5 (*) 0.7 - 4.0 K/uL   Monocytes Relative 4  3 - 12 %   Monocytes Absolute 0.4  0.1 - 1.0 K/uL   Eosinophils Relative 0  0 - 5 %   Eosinophils Absolute 0.0  0.0 - 0.7 K/uL   Basophils Relative 0  0 - 1 %   Basophils Absolute  0.0  0.0 - 0.1 K/uL  COMPREHENSIVE METABOLIC PANEL     Status: Abnormal   Collection Time    02/02/14 10:20 AM      Result Value Ref Range   Sodium 139  137 - 147 mEq/L   Potassium 4.2  3.7 - 5.3 mEq/L   Chloride 100  96 - 112 mEq/L   CO2 22  19 - 32 mEq/L   Glucose, Bld 128 (*) 70 - 99 mg/dL   BUN 20  6 - 23 mg/dL   Creatinine, Ser 0.89  0.50 - 1.35 mg/dL   Calcium 9.3  8.4 - 10.5 mg/dL   Total Protein 7.1  6.0 - 8.3 g/dL   Albumin 4.0  3.5 - 5.2 g/dL   AST 25  0 - 37 U/L   ALT 29  0 - 53 U/L   Alkaline Phosphatase 49  39 - 117 U/L   Total Bilirubin 0.6  0.3 - 1.2 mg/dL   GFR calc non Af Amer 85 (*) >90 mL/min   GFR calc Af Amer >90  >90 mL/min   Comment: (NOTE)     The eGFR has been calculated using the CKD EPI equation.     This calculation has not been validated in all clinical situations.     eGFR's persistently <90 mL/min signify possible Chronic Kidney     Disease.  LIPASE, BLOOD     Status: None   Collection Time    02/02/14 10:20 AM      Result Value Ref Range   Lipase 16  11 - 59 U/L  URINALYSIS, ROUTINE W REFLEX MICROSCOPIC     Status: Abnormal   Collection Time    02/02/14 12:04 PM      Result Value Ref Range   Color, Urine YELLOW  YELLOW   APPearance CLEAR  CLEAR   Specific Gravity, Urine 1.026  1.005 - 1.030   pH 5.5  5.0 - 8.0   Glucose, UA NEGATIVE  NEGATIVE mg/dL   Hgb urine dipstick NEGATIVE  NEGATIVE   Bilirubin Urine SMALL (*) NEGATIVE   Ketones, ur >80 (*) NEGATIVE mg/dL   Protein, ur NEGATIVE  NEGATIVE mg/dL   Urobilinogen, UA 0.2  0.0 - 1.0 mg/dL   Nitrite NEGATIVE  NEGATIVE   Leukocytes, UA NEGATIVE  NEGATIVE   Comment: MICROSCOPIC NOT DONE ON URINES WITH NEGATIVE PROTEIN, BLOOD, LEUKOCYTES, NITRITE, OR GLUCOSE <1000 mg/dL.    Imaging / Studies: Ct Abdomen Pelvis W Contrast  02/02/2014   CLINICAL DATA:  Right lower quadrant pain. History of appendicitis in January treated with antibiotics.  EXAM: CT ABDOMEN AND PELVIS WITH CONTRAST   TECHNIQUE: Multidetector CT imaging of the  abdomen and pelvis was performed using the standard protocol following bolus administration of intravenous contrast.  CONTRAST:  130m OMNIPAQUE IOHEXOL 300 MG/ML  SOLN  COMPARISON:  CT abdomen 10/25/2013, 11/23/2013  FINDINGS: Findings compatible with acute appendicitis. There is appendiceal thickening and periappendiceal edema. The appearance is similar to the CT of 10/25/2013. This resolved and the appendix was normal on 11/23/2013. Negative for appendicolith. No abscess or free fluid.  Negative for bowel obstruction. 15 mm hypodensity in the lateral segment left lobe liver is unchanged and may represent focal fatty infiltration. This is nonspecific. No other liver lesions. Gallbladder and bile ducts are normal. Pancreas spleen and kidneys are normal.  Negative for mass or adenopathy. Prostate enlargement. No acute spinal abnormality. Mild degenerative change.  IMPRESSION: Findings consistent with acute appendicitis without abscess. Note the patient had appendicitis 10/25/2013 which resolved on antibiotics and has recurred.   Electronically Signed   By: CFranchot GalloM.D.   On: 02/02/2014 13:23   Dg Chest Portable 1 View  02/02/2014   CLINICAL DATA:  Abdominal pain.  Rule out free air.  EXAM: PORTABLE CHEST - 1 VIEW  COMPARISON:  10/25/2013  FINDINGS: Hypoventilation with mild bibasilar atelectasis. Negative for infiltrate or edema. No effusion. No free air identified, however this is a portable semi upright view which could miss free air. If there is further concern, two view of the abdomen is suggested.  IMPRESSION: Hypoventilation with mild bibasilar atelectasis.   Electronically Signed   By: CFranchot GalloM.D.   On: 02/02/2014 10:29    Medications / Allergies: per chart  Antibiotics: Anti-infectives   Start     Dose/Rate Route Frequency Ordered Stop   02/02/14 2200  piperacillin-tazobactam (ZOSYN) IVPB 3.375 g     3.375 g 12.5 mL/hr over 240 Minutes  Intravenous 3 times per day 02/02/14 1457     02/02/14 1345  piperacillin-tazobactam (ZOSYN) IVPB 3.375 g     3.375 g 100 mL/hr over 30 Minutes Intravenous  Once 02/02/14 1335 02/02/14 1445       Note: This dictation was prepared with Dragon/digital dictation along with [employer] Any transcriptional errors that result from this process are unintentional.

## 2014-02-04 MED ORDER — OXYCODONE HCL 5 MG PO TABS: 5.0000 mg | ORAL_TABLET | ORAL | Status: DC | PRN

## 2014-02-04 NOTE — Discharge Summary (Signed)
Physician Discharge Summary  Patient ID: Clinton Fuller MRN: [id number] DOB/AGE: 71-Nov-1942 71 y.o.  Admit date: 02/02/2014 Discharge date: 02/04/2014  Admission Diagnoses:  Discharge Diagnoses:  Active Problems:   Acute appendicitis   Discharged Condition: good  Hospital Course: Pt with recurrent appendicitis.  Underwent urgent laparoscopic appendectomy on day of admission.  Postoperatively, the patient mobilized in the hallways and advanced to a solid diet gradually.  Pain was well-controlled and transitioned off IV medications.    By the time of discharge, the patient was walking well the hallways, eating food well, having flatus.  Pain was-controlled on an oral regimen.  Based on meeting DC criteria and recovering well, I felt it was safe for the patient to be discharged home with close followup.  Instructions were discussed in detail.  They are written as well.     Consults: None  Significant Diagnostic Studies:   Results for orders placed during the hospital encounter of 02/02/14 (from the past 72 hour(s))  CBC WITH DIFFERENTIAL     Status: Abnormal   Collection Time    02/02/14 10:20 AM      Result Value Ref Range   WBC 9.1  4.0 - 10.5 K/uL   RBC 4.81  4.22 - 5.81 MIL/uL   Hemoglobin 15.6  13.0 - 17.0 g/dL   HCT 43.9  39.0 - 52.0 %   MCV 91.3  78.0 - 100.0 fL   MCH 32.4  26.0 - 34.0 pg   MCHC 35.5  30.0 - 36.0 g/dL   RDW 12.3  11.5 - 15.5 %   Platelets 268  150 - 400 K/uL   Neutrophils Relative % 90 (*) 43 - 77 %   Neutro Abs 8.2 (*) 1.7 - 7.7 K/uL   Lymphocytes Relative 6 (*) 12 - 46 %   Lymphs Abs 0.5 (*) 0.7 - 4.0 K/uL   Monocytes Relative 4  3 - 12 %   Monocytes Absolute 0.4  0.1 - 1.0 K/uL   Eosinophils Relative 0  0 - 5 %   Eosinophils Absolute 0.0  0.0 - 0.7 K/uL   Basophils Relative 0  0 - 1 %   Basophils Absolute 0.0  0.0 - 0.1 K/uL  COMPREHENSIVE METABOLIC PANEL     Status: Abnormal   Collection Time    02/02/14 10:20 AM      Result Value Ref  Range   Sodium 139  137 - 147 mEq/L   Potassium 4.2  3.7 - 5.3 mEq/L   Chloride 100  96 - 112 mEq/L   CO2 22  19 - 32 mEq/L   Glucose, Bld 128 (*) 70 - 99 mg/dL   BUN 20  6 - 23 mg/dL   Creatinine, Ser 0.89  0.50 - 1.35 mg/dL   Calcium 9.3  8.4 - 10.5 mg/dL   Total Protein 7.1  6.0 - 8.3 g/dL   Albumin 4.0  3.5 - 5.2 g/dL   AST 25  0 - 37 U/L   ALT 29  0 - 53 U/L   Alkaline Phosphatase 49  39 - 117 U/L   Total Bilirubin 0.6  0.3 - 1.2 mg/dL   GFR calc non Af Amer 85 (*) >90 mL/min   GFR calc Af Amer >90  >90 mL/min   Comment: (NOTE)     The eGFR has been calculated using the CKD EPI equation.     This calculation has not been validated in all clinical situations.     eGFR's persistently <  90 mL/min signify possible Chronic Kidney     Disease.  LIPASE, BLOOD     Status: None   Collection Time    02/02/14 10:20 AM      Result Value Ref Range   Lipase 16  11 - 59 U/L  URINALYSIS, ROUTINE W REFLEX MICROSCOPIC     Status: Abnormal   Collection Time    02/02/14 12:04 PM      Result Value Ref Range   Color, Urine YELLOW  YELLOW   APPearance CLEAR  CLEAR   Specific Gravity, Urine 1.026  1.005 - 1.030   pH 5.5  5.0 - 8.0   Glucose, UA NEGATIVE  NEGATIVE mg/dL   Hgb urine dipstick NEGATIVE  NEGATIVE   Bilirubin Urine SMALL (*) NEGATIVE   Ketones, ur >80 (*) NEGATIVE mg/dL   Protein, ur NEGATIVE  NEGATIVE mg/dL   Urobilinogen, UA 0.2  0.0 - 1.0 mg/dL   Nitrite NEGATIVE  NEGATIVE   Leukocytes, UA NEGATIVE  NEGATIVE   Comment: MICROSCOPIC NOT DONE ON URINES WITH NEGATIVE PROTEIN, BLOOD, LEUKOCYTES, NITRITE, OR GLUCOSE <1000 mg/dL.   Ct Abdomen Pelvis W Contrast  02/02/2014   CLINICAL DATA:  Right lower quadrant pain. History of appendicitis in January treated with antibiotics.  EXAM: CT ABDOMEN AND PELVIS WITH CONTRAST  TECHNIQUE: Multidetector CT imaging of the abdomen and pelvis was performed using the standard protocol following bolus administration of intravenous contrast.   CONTRAST:  162m OMNIPAQUE IOHEXOL 300 MG/ML  SOLN  COMPARISON:  CT abdomen 10/25/2013, 11/23/2013  FINDINGS: Findings compatible with acute appendicitis. There is appendiceal thickening and periappendiceal edema. The appearance is similar to the CT of 10/25/2013. This resolved and the appendix was normal on 11/23/2013. Negative for appendicolith. No abscess or free fluid.  Negative for bowel obstruction. 15 mm hypodensity in the lateral segment left lobe liver is unchanged and may represent focal fatty infiltration. This is nonspecific. No other liver lesions. Gallbladder and bile ducts are normal. Pancreas spleen and kidneys are normal.  Negative for mass or adenopathy. Prostate enlargement. No acute spinal abnormality. Mild degenerative change.  IMPRESSION: Findings consistent with acute appendicitis without abscess. Note the patient had appendicitis 10/25/2013 which resolved on antibiotics and has recurred.   Electronically Signed   By: CFranchot GalloM.D.   On: 02/02/2014 13:23   Dg Chest Portable 1 View  02/02/2014   CLINICAL DATA:  Abdominal pain.  Rule out free air.  EXAM: PORTABLE CHEST - 1 VIEW  COMPARISON:  10/25/2013  FINDINGS: Hypoventilation with mild bibasilar atelectasis. Negative for infiltrate or edema. No effusion. No free air identified, however this is a portable semi upright view which could miss free air. If there is further concern, two view of the abdomen is suggested.  IMPRESSION: Hypoventilation with mild bibasilar atelectasis.   Electronically Signed   By: CFranchot GalloM.D.   On: 02/02/2014 10:29   Treatments:   DATE OF PROCEDURE: 02/02/2014  DATE OF DISCHARGE:  OPERATIVE REPORT  PREOPERATIVE DIAGNOSIS: Acute appendicitis.  POSTOPERATIVE DIAGNOSIS: Acute appendicitis.  PROCEDURE: Laparoscopic appendectomy.  SURGEON: DCoralie Keens M.D.   FINDINGS: The patient was found to have a contracted appendix. There  was a moderate amount of fibrinous exudate in the right lower  quadrant,  but no Sherriann Szuch abscess or signs of perforation.   Discharge Exam: Blood pressure 128/58, pulse 87, temperature 98.8 F (37.1 C), temperature source Oral, resp. rate 17, height '5\' 6"'  (1.676 m), weight 220 lb (99.791 kg), SpO2 93.00%.  General: Pt awake/alert/oriented x4 in no major acute distress Eyes: PERRL, normal EOM. Sclera nonicteric Neuro: CN II-XII intact w/o focal sensory/motor deficits. Lymph: No head/neck/groin lymphadenopathy Psych:  No delerium/psychosis/paranoia HENT: Normocephalic, Mucus membranes moist.  No thrush Neck: Supple, No tracheal deviation Chest: No pain.  Good respiratory excursion. CV:  Pulses intact.  Regular rhythm MS: Normal AROM mjr joints.  No obvious deformity Abdomen: Soft, Obese.  Min distended - improved.  Mild TTP RLQ/RUQ.  No incarcerated hernias. Ext:  SCDs BLE.  No significant edema.  No cyanosis Skin: No petechiae / purpura   Disposition: 01-Home or Self Care  Discharge Orders   Future Orders Complete By Expires   Call MD for:  extreme fatigue  As directed    Call MD for:  hives  As directed    Call MD for:  persistant nausea and vomiting  As directed    Call MD for:  redness, tenderness, or signs of infection (pain, swelling, redness, odor or green/yellow discharge around incision site)  As directed    Call MD for:  severe uncontrolled pain  As directed    Call MD for:  As directed    Diet - low sodium heart healthy  As directed    Discharge instructions  As directed    Discharge wound care:  As directed    Driving Restrictions  As directed    Increase activity slowly  As directed    Lifting restrictions  As directed    May shower / Bathe  As directed    May walk up steps  As directed    Sexual Activity Restrictions  As directed    Walk with assistance  As directed        Medication List         aspirin EC 81 MG tablet  Take 81 mg by mouth daily after lunch.     AVODART 0.5 MG capsule  Generic drug:  dutasteride   Take 0.5 mg by mouth daily after lunch.     latanoprost 0.005 % ophthalmic solution  Commonly known as:  XALATAN  Place 1 drop into both eyes at bedtime.     omeprazole 20 MG tablet  Commonly known as:  PRILOSEC OTC  Take 20 mg by mouth daily after lunch.     oxyCODONE 5 MG immediate release tablet  Commonly known as:  Oxy IR/ROXICODONE  Take 1-2 tablets (5-10 mg total) by mouth every 4 (four) hours as needed for moderate pain, severe pain or breakthrough pain.     valsartan-hydrochlorothiazide 160-25 MG per tablet  Commonly known as:  DIOVAN-HCT  Take 1 tablet by mouth daily after lunch.           Follow-up Information   Follow up with CCS,MD, MD. Schedule an appointment as soon as possible for a visit in 2 weeks. (Tomorrow (Monday), call [phone] to set up appointment to follow up after your operation)    Specialty:  General Surgery      Signed: Adin Hector 02/04/2014, 7:49 AM

## 2014-02-04 NOTE — Discharge Instructions (Signed)
LAPAROSCOPIC SURGERY: POST OP INSTRUCTIONS ° °1. DIET: Follow a light bland diet the first 24 hours after arrival home, such as soup, liquids, crackers, etc.  Be sure to include lots of fluids daily.  Avoid fast food or heavy meals as your are more likely to get nauseated.  Eat a low fat the next few days after surgery.   °2. Take your usually prescribed home medications unless otherwise directed. °3. PAIN CONTROL: °a. Pain is best controlled by a usual combination of three different methods TOGETHER: °i. Ice/Heat °ii. Over the counter pain medication °iii. Prescription pain medication °b. Most patients will experience some swelling and bruising around the incisions.  Ice packs or heating pads (30-60 minutes up to 6 times a day) will help. Use ice for the first few days to help decrease swelling and bruising, then switch to heat to help relax tight/sore spots and speed recovery.  Some people prefer to use ice alone, heat alone, alternating between ice & heat.  Experiment to what works for you.  Swelling and bruising can take several weeks to resolve.   °c. It is helpful to take an over-the-counter pain medication regularly for the first few weeks.  Choose one of the following that works best for you: °i. Naproxen (Aleve, etc)  Two 220mg tabs twice a day °ii. Ibuprofen (Advil, etc) Three 200mg tabs four times a day (every meal & bedtime) °iii. Acetaminophen (Tylenol, etc) 500-650mg four times a day (every meal & bedtime) °d. A  prescription for pain medication (such as oxycodone, hydrocodone, etc) should be given to you upon discharge.  Take your pain medication as prescribed.  °i. If you are having problems/concerns with the prescription medicine (does not control pain, nausea, vomiting, rash, itching, etc), please call us [phone] to see if we need to switch you to a different pain medicine that will work better for you and/or control your side effect better. °ii. If you need a refill on your pain medication,  please contact your pharmacy.  They will contact our office to request authorization. Prescriptions will not be filled after 5 pm or on week-ends. °4. Avoid getting constipated.  Between the surgery and the pain medications, it is common to experience some constipation.  Increasing fluid intake and taking a fiber supplement (such as Metamucil, Citrucel, FiberCon, MiraLax, etc) 1-2 times a day regularly will usually help prevent this problem from occurring.  A mild laxative (prune juice, Milk of Magnesia, MiraLax, etc) should be taken according to package directions if there are no bowel movements after 48 hours.   °5. Watch out for diarrhea.  If you have many loose bowel movements, simplify your diet to bland foods & liquids for a few days.  Stop any stool softeners and decrease your fiber supplement.  Switching to mild anti-diarrheal medications (Kayopectate, Pepto Bismol) can help.  If this worsens or does not improve, please call us. °6. Wash / shower every day.  You may shower over the dressings as they are waterproof.  Continue to shower over incision(s) after the dressing is off. °7. Remove your waterproof bandages 5 days after surgery.  You may leave the incision open to air.  You may replace a dressing/Band-Aid to cover the incision for comfort if you wish.  °8. ACTIVITIES as tolerated:   °a. You may resume regular (light) daily activities beginning the next day--such as daily self-care, walking, climbing stairs--gradually increasing activities as tolerated.  If you can walk 30 minutes without difficulty, it   is safe to try more intense activity such as jogging, treadmill, bicycling, low-impact aerobics, swimming, etc. b. Save the most intensive and strenuous activity for last such as sit-ups, heavy lifting, contact sports, etc  Refrain from any heavy lifting or straining until you are off narcotics for pain control.   c. DO NOT PUSH THROUGH PAIN.  Let pain be your guide: If it hurts to do something, don't  do it.  Pain is your body warning you to avoid that activity for another week until the pain goes down. d. You may drive when you are no longer taking prescription pain medication, you can comfortably wear a seatbelt, and you can safely maneuver your car and apply brakes. e. Dennis Bast may have sexual intercourse when it is comfortable.  9. FOLLOW UP in our office a. Please call [facility] at [phone] to set up an appointment to see your surgeon in the office for a follow-up appointment approximately 2-3 weeks after your surgery. b. Make sure that you call for this appointment the day you arrive home to insure a convenient appointment time. 10. IF YOU HAVE DISABILITY OR FAMILY LEAVE FORMS, BRING THEM TO THE OFFICE FOR PROCESSING.  DO NOT GIVE THEM TO YOUR DOCTOR.   WHEN TO CALL us [phone]: 1. Poor pain control 2. Reactions / problems with new medications (rash/itching, nausea, etc)  3. Fever over 101.5 F (38.5 C) 4. Inability to urinate 5. Nausea and/or vomiting 6. Worsening swelling or bruising 7. Continued bleeding from incision. 8. Increased pain, redness, or drainage from the incision   The clinic staff is available to answer your questions during regular business hours (8:30am-5pm).  Please dont hesitate to call and ask to speak to one of our nurses for clinical concerns.   If you have a medical emergency, go to the nearest emergency room or call 911.  A surgeon from [facility] is always on call at the [facility], [place], [place], [place], [place]  [address] ? MAIN: [phone] ? TOLL FREE: [phone] ?  FAX [phone] [email or web address]   Appendicitis Appendicitis is when the appendix is swollen (inflamed). The inflammation can lead to developing a hole (perforation) and a collection of pus (abscess). CAUSES  There is not always an obvious cause of appendicitis. Sometimes it is caused by an  obstruction in the appendix. The obstruction can be caused by:  A small, hard, pea-sized ball of stool (fecalith).  Enlarged lymph glands in the appendix. SYMPTOMS   Pain around your belly button (navel) that moves toward your lower right belly (abdomen). The pain can become more severe and sharp as time passes.  Tenderness in the lower right abdomen. Pain gets worse if you cough or make a sudden movement.  Feeling sick to your stomach (nauseous).  Throwing up (vomiting).  Loss of appetite.  Fever.  Constipation.  Diarrhea.  Generally not feeling well. DIAGNOSIS   Physical exam.  Blood tests.  Urine test.  X-rays or a CT scan may confirm the diagnosis. TREATMENT  Once the diagnosis of appendicitis is made, the most common treatment is to remove the appendix as soon as possible. This procedure is called appendectomy. In an open appendectomy, a cut (incision) is made in the lower right abdomen and the appendix is removed. In a laparoscopic appendectomy, usually 3 small incisions are made. Long, thin instruments and a camera tube are used to remove the appendix. Most patients go home  in 24 to 48 hours after appendectomy. In some situations, the appendix may have already perforated and an abscess may have formed. The abscess may have a "wall" around it as seen on a CT scan. In this case, a drain may be placed into the abscess to remove fluid, and you may be treated with antibiotic medicines that kill germs. The medicine is given through a tube in your vein (IV). Once the abscess has resolved, it may or may not be necessary to have an appendectomy. You may need to stay in the hospital longer than 48 hours. Document Released: 09/28/2005 Document Revised: 03/29/2012 Document Reviewed: 12/24/2009 [facility] Patient Information 2014 [employer], [place].  Managing Pain  Pain after surgery or related to activity is often due to strain/injury to muscle, tendon, nerves and/or incisions.  This  pain is usually short-term and will improve in a few months.   Many people find it helpful to do the following things TOGETHER to help speed the process of healing and to get back to regular activity more quickly:  1. Avoid heavy physical activity a.  no lifting greater than 20 pounds b. Do not push through the pain.  Listen to your body and avoid positions and maneuvers than reproduce the pain c. Walking is okay as tolerated, but go slowly and stop when getting sore.  d. Remember: If it hurts to do it, then dont do it! 2. Take Anti-inflammatory medication  a. Take with food/snack around the clock for 1-2 weeks i. This helps the muscle and nerve tissues become less irritable and calm down faster b. Choose ONE of the following over-the-counter medications: i. Naproxen 220mg  tabs (ex. Aleve) 1-2 pills twice a day  ii. Ibuprofen 200mg  tabs (ex. Advil, Motrin) 3-4 pills with every meal and just before bedtime iii. Acetaminophen 500mg  tabs (Tylenol) 1-2 pills with every meal and just before bedtime 3. Use a Heating pad or Ice/Cold Pack a. 4-6 times a day b. May use warm bath/hottub  or showers 4. Try Gentle Massage and/or Stretching  a. at the area of pain many times a day b. stop if you feel pain - do not overdo it  Try these steps together to help you body heal faster and avoid making things get worse.  Doing just one of these things may not be enough.    If you are not getting better after two weeks or are noticing you are getting worse, contact our office for further advice; we may need to re-evaluate you & see what other things we can do to help.  GETTING TO GOOD BOWEL HEALTH. Irregular bowel habits such as constipation and diarrhea can lead to many problems over time.  Having one soft bowel movement a day is the most important way to prevent further problems.  The anorectal canal is designed to handle stretching and feces to safely manage our ability to get rid of solid waste (feces,  poop, stool) out of our body.  BUT, hard constipated stools can act like ripping concrete bricks and diarrhea can be a burning fire to this very sensitive area of our body, causing inflamed hemorrhoids, anal fissures, increasing risk is perirectal abscesses, abdominal pain/bloating, an making irritable bowel worse.     The goal: ONE SOFT BOWEL MOVEMENT A DAY!  To have soft, regular bowel movements:    Drink at least 8 tall glasses of water a day.     Take plenty of fiber.  Fiber is the undigested part of plant food  that passes into the colon, acting s natures broom to encourage bowel motility and movement.  Fiber can absorb and hold large amounts of water. This results in a larger, bulkier stool, which is soft and easier to pass. Work gradually over several weeks up to 6 servings a day of fiber (25g a day even more if needed) in the form of: o Vegetables -- Root (potatoes, carrots, turnips), leafy green (lettuce, salad greens, celery, spinach), or cooked high residue (cabbage, broccoli, etc) o Fruit -- Fresh (unpeeled skin & pulp), Dried (prunes, apricots, cherries, etc ),  or stewed ( applesauce)  o Whole grain breads, pasta, etc (whole wheat)  o Bran cereals    Bulking Agents -- This type of water-retaining fiber generally is easily obtained each day by one of the following:  o Psyllium bran -- The psyllium plant is remarkable because its ground seeds can retain so much water. This product is available as Metamucil, Konsyl, Effersyllium, Per Diem Fiber, or the less expensive generic preparation in drug and health food stores. Although labeled a laxative, it really is not a laxative.  o Methylcellulose -- This is another fiber derived from wood which also retains water. It is available as Citrucel. o Polyethylene Glycol - and artificial fiber commonly called Miralax or Glycolax.  It is helpful for people with gassy or bloated feelings with regular fiber o Flax Seed - a less gassy fiber than  psyllium   No reading or other relaxing activity while on the toilet. If bowel movements take longer than 5 minutes, you are too constipated   AVOID CONSTIPATION.  High fiber and water intake usually takes care of this.  Sometimes a laxative is needed to stimulate more frequent bowel movements, but    Laxatives are not a good long-term solution as it can wear the colon out. o Osmotics (Milk of Magnesia, Fleets phosphosoda, Magnesium citrate, MiraLax, GoLytely) are safer than  o Stimulants (Senokot, Castor Oil, Dulcolax, Ex Lax)    o Do not take laxatives for more than 7days in a row.    IF SEVERELY CONSTIPATED, try a Bowel Retraining Program: o Do not use laxatives.  o Eat a diet high in roughage, such as bran cereals and leafy vegetables.  o Drink six (6) ounces of prune or apricot juice each morning.  o Eat two (2) large servings of stewed fruit each day.  o Take one (1) heaping tablespoon of a psyllium-based bulking agent twice a day. Use sugar-free sweetener when possible to avoid excessive calories.  o Eat a normal breakfast.  o Set aside 15 minutes after breakfast to sit on the toilet, but do not strain to have a bowel movement.  o If you do not have a bowel movement by the third day, use an enema and repeat the above steps.    Controlling diarrhea o Switch to liquids and simpler foods for a few days to avoid stressing your intestines further. o Avoid dairy products (especially milk & ice cream) for a short time.  The intestines often can lose the ability to digest lactose when stressed. o Avoid foods that cause gassiness or bloating.  Typical foods include beans and other legumes, cabbage, broccoli, and dairy foods.  Every person has some sensitivity to other foods, so listen to our body and avoid those foods that trigger problems for you. o Adding fiber (Citrucel, Metamucil, psyllium, Miralax) gradually can help thicken stools by absorbing excess fluid and retrain the intestines to act  more  normally.  Slowly increase the dose over a few weeks.  Too much fiber too soon can backfire and cause cramping & bloating. o Probiotics (such as active yogurt, Align, etc) may help repopulate the intestines and colon with normal bacteria and calm down a sensitive digestive tract.  Most studies show it to be of mild help, though, and such products can be costly. o Medicines:   Bismuth subsalicylate (ex. Kayopectate, Pepto Bismol) every 30 minutes for up to 6 doses can help control diarrhea.  Avoid if pregnant.   Loperamide (Immodium) can slow down diarrhea.  Start with two tablets (4mg  total) first and then try one tablet every 6 hours.  Avoid if you are having fevers or severe pain.  If you are not better or start feeling worse, stop all medicines and call your doctor for advice o Call your doctor if you are getting worse or not better.  Sometimes further testing (cultures, endoscopy, X-ray studies, bloodwork, etc) may be needed to help diagnose and treat the cause of the diarrhea. o

## 2014-02-04 NOTE — Discharge Planning (Signed)
Patient discharged home in stable condition. Verbalizes understanding of all discharge instructions, including home medications and follow up appointments. 

## 2014-02-06 ENCOUNTER — Encounter (HOSPITAL_COMMUNITY): Payer: Self-pay | Admitting: Surgery

## 2014-02-06 NOTE — Anesthesia Postprocedure Evaluation (Signed)
Anesthesia Post Note  Patient: Clinton RoughenDavid Fuller  Procedure(s) Performed: Procedure(s) (LRB): APPENDECTOMY LAPAROSCOPIC (N/A)  Anesthesia type: General  Patient location: PACU  Post pain: Pain level controlled and Adequate analgesia  Post assessment: Post-op Vital signs reviewed, Patient's Cardiovascular Status Stable, Respiratory Function Stable, Patent Airway and Pain level controlled  Last Vitals:  Filed Vitals:   02/04/14 0535  BP: 128/58  Pulse: 87  Temp: 37.1 C  Resp: 17    Post vital signs: Reviewed and stable  Level of consciousness: awake, alert  and oriented  Complications: No apparent anesthesia complications

## 2014-02-14 ENCOUNTER — Encounter (INDEPENDENT_AMBULATORY_CARE_PROVIDER_SITE_OTHER): Payer: Self-pay | Admitting: Surgery

## 2014-02-19 ENCOUNTER — Encounter (INDEPENDENT_AMBULATORY_CARE_PROVIDER_SITE_OTHER): Payer: Self-pay | Admitting: Surgery

## 2014-02-19 ENCOUNTER — Ambulatory Visit (INDEPENDENT_AMBULATORY_CARE_PROVIDER_SITE_OTHER): Payer: Medicare Other | Admitting: Surgery

## 2014-02-19 VITALS — BP 124/88 | HR 66 | Temp 97.7°F | Resp 18 | Ht 66.0 in | Wt 219.6 lb

## 2014-02-19 DIAGNOSIS — Z09 Encounter for follow-up examination after completed treatment for conditions other than malignant neoplasm: Secondary | ICD-10-CM

## 2014-02-19 NOTE — Progress Notes (Signed)
Subjective:     Patient ID: Clinton Fuller, male   DOB: 04-25-1941, 73 y.o.   MRN: [id number]  HPI He is here for his first postop visit status post laparoscopic appendectomy. He is doing well except for some mild thigh discomfort. He denies fevers. He is moving his bowels well  Review of Systems     Objective:   Physical Exam On exam, his incisions are healing well. His abdomen is soft. The final pathology showed Acute appendicitis    Assessment:   Patient stable postop    Plan:     He may resume his normal activity including lifting. He will come back and see me as needed

## 2015-09-06 IMAGING — CT CT ABD-PELV W/ CM
2 of 5 series · 17 of 46 positions shown, 19 images · IV contrast (READICAT/WATER & [ID] OMNI [id number])
Comparison: None

CLINICAL DATA: Right lower quadrant pain for 4 days, leukocytosis,
question appendicitis

EXAM:
CT ABDOMEN AND PELVIS WITH CONTRAST
TECHNIQUE: Multidetector CT imaging of the abdomen and pelvis was performed
using the standard protocol following bolus administration of
intravenous contrast. Sagittal and coronal MPR images reconstructed
from axial data set.
CONTRAST:  125mL OMNIPAQUE IOHEXOL 300 MG/ML SOLN. Dilute oral
contrast.

[Series 2: abd/pelvis with · axial · 0.78mm/px · z∈[-413,-28]mm · 14 of 87 slices shown, 16 images]
[im 5/87  soft-tissue]
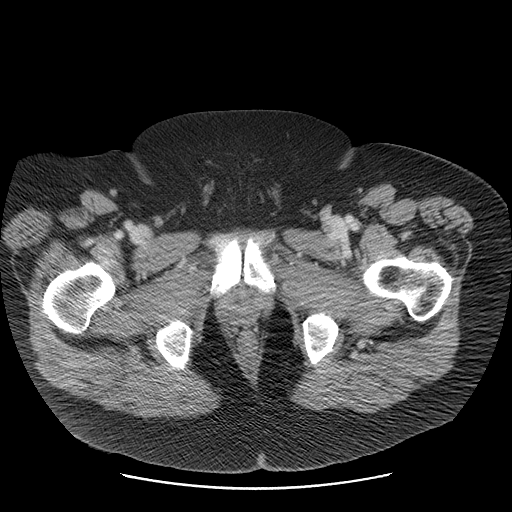
[im 5/87  bone]
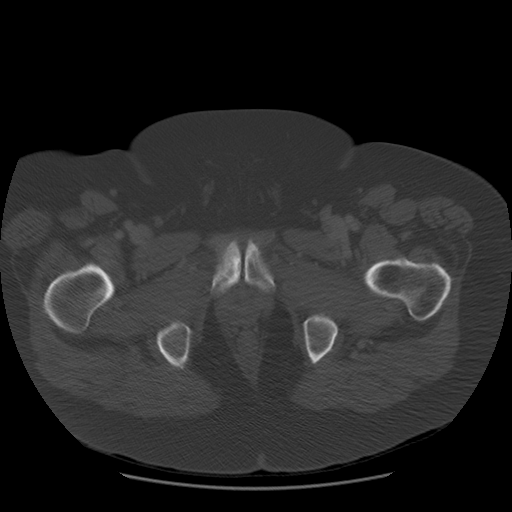
[im 10/87  soft-tissue]
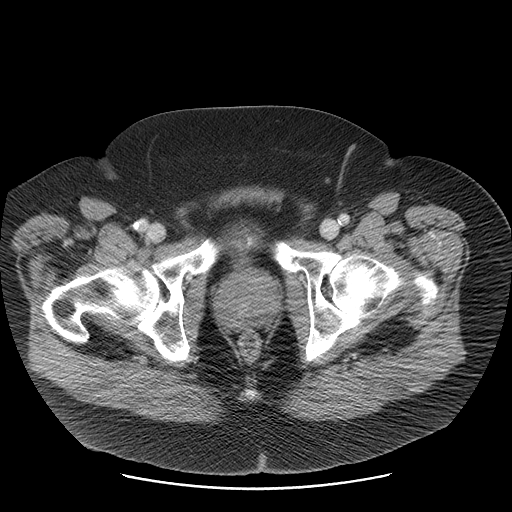
[im 20/87  soft-tissue]
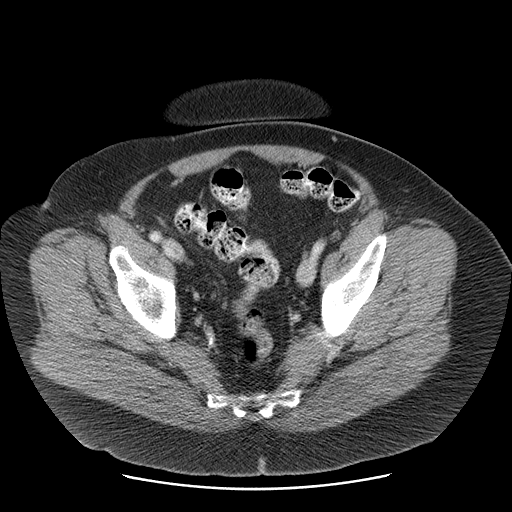
[im 24/87  soft-tissue]
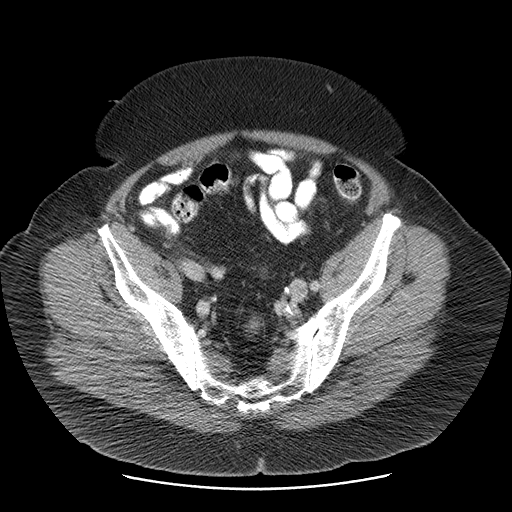
[im 29/87  soft-tissue]
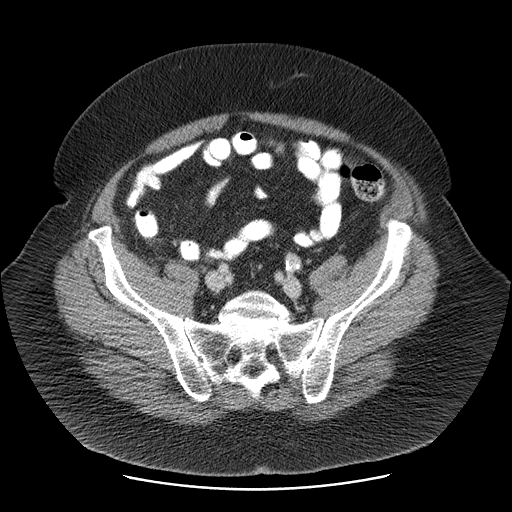
[im 34/87  soft-tissue]
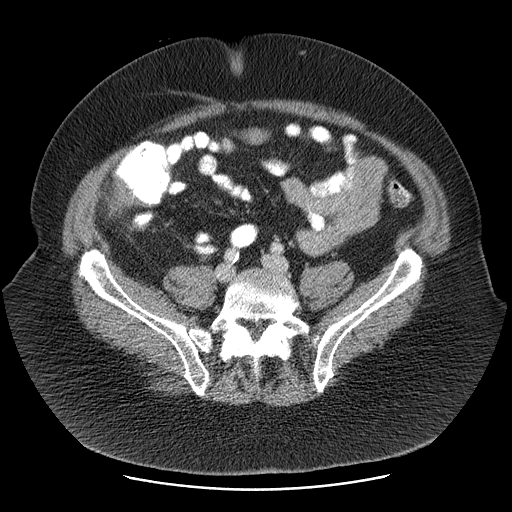
[im 39/87  soft-tissue]
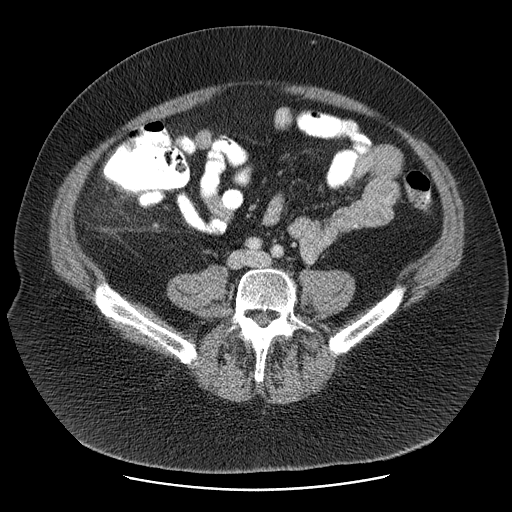
[im 48/87  soft-tissue]
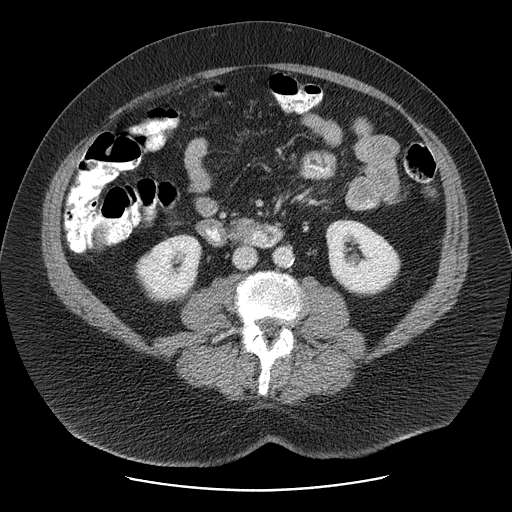
[im 53/87  soft-tissue]
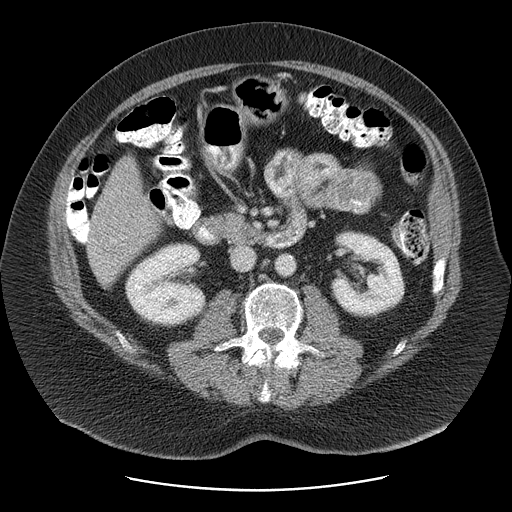
[im 53/87  bone]
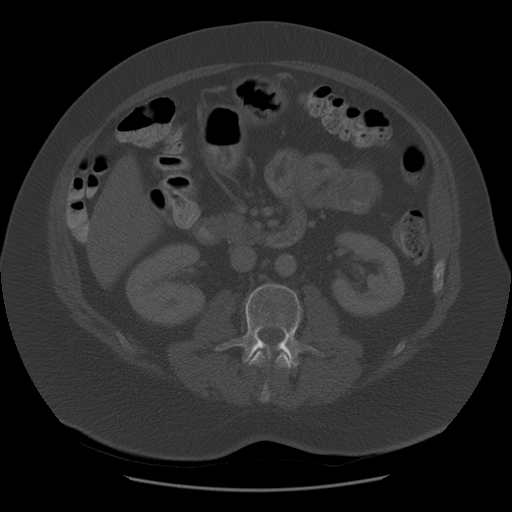
[im 58/87  soft-tissue]
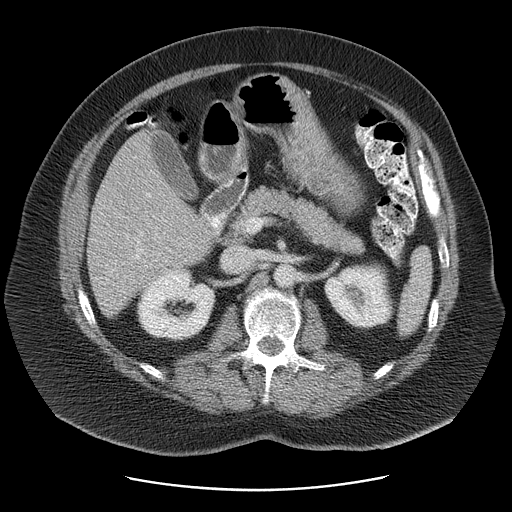
[im 63/87  soft-tissue]
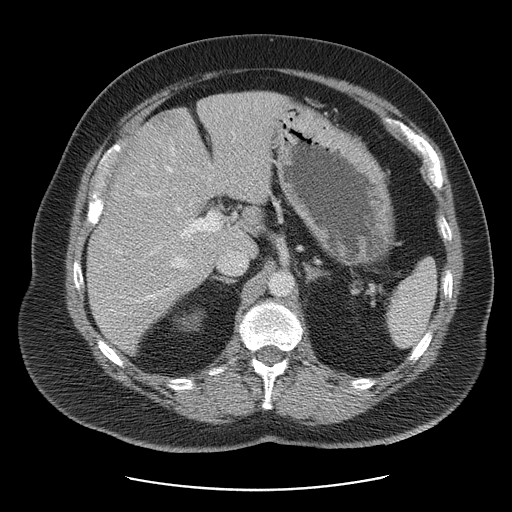
[im 67/87  soft-tissue]
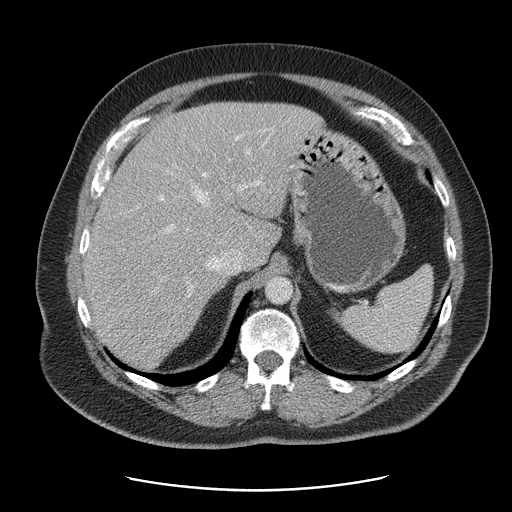
[im 77/87  soft-tissue]
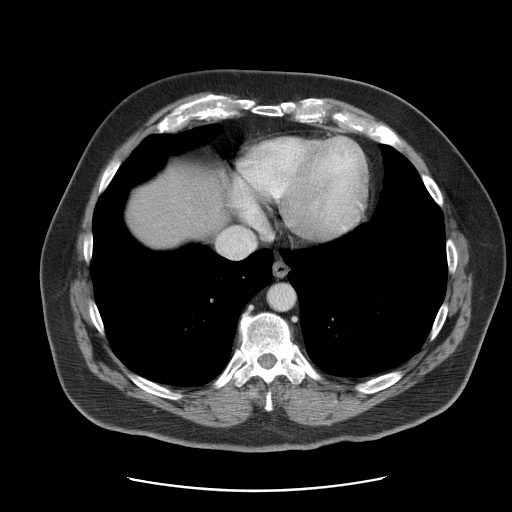
[im 82/87  soft-tissue]
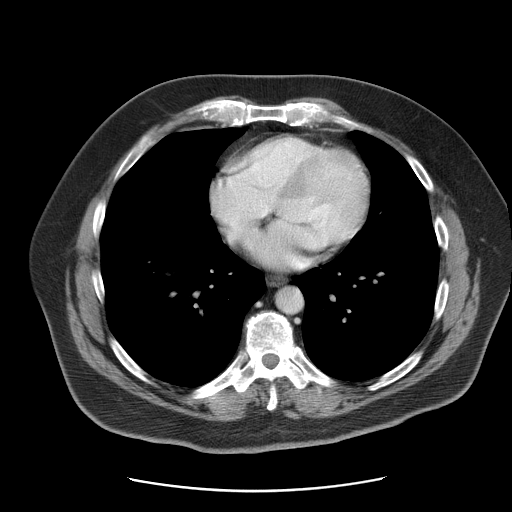

[Series 400: cor · coronal · 0.91mm/px · 3 of 160 slices shown]
[im 54/160  soft-tissue]
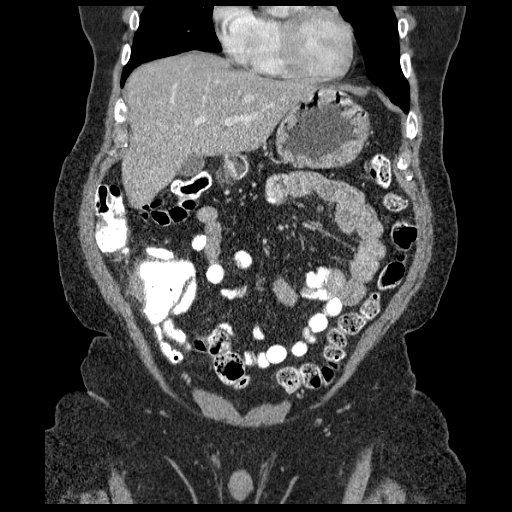
[im 71/160  soft-tissue]
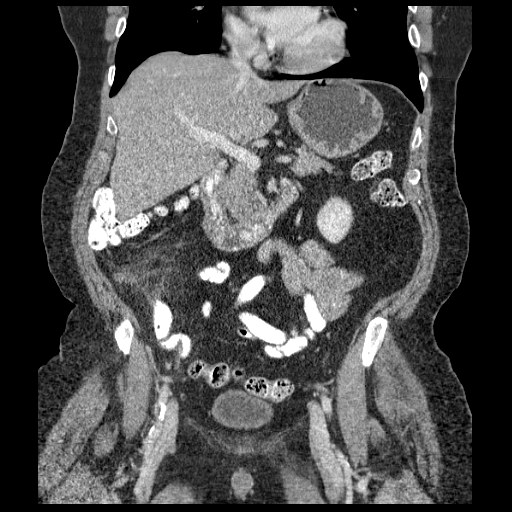
[im 89/160  soft-tissue]
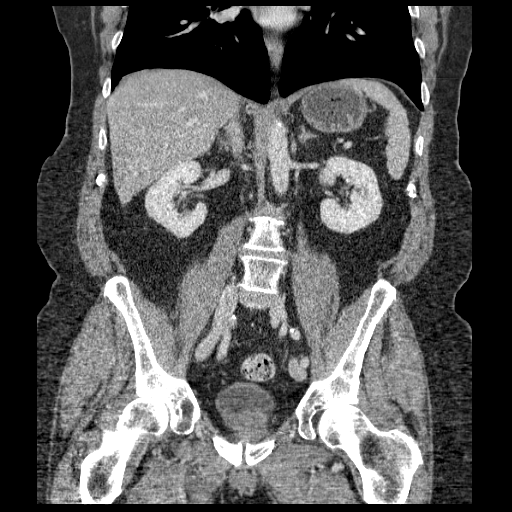

[17 of 46 positions shown; findings below may reference images not displayed]

FINDINGS: Lung bases clear.

Liver, spleen, pancreas, kidneys, and adrenal glands normal
appearance.

Thickened enlarged appendix with periappendiceal inflammatory
changes and thickening of the adjacent cecal wall consistent with
acute appendicitis.

No evidence of discrete abscess or perforation.

Few adjacent normal sized lymph nodes.

Stomach and bowel loops otherwise grossly normal appearance.

Minimal scattered atherosclerotic calcification.

Mild prostatic enlargement and associated bladder wall thickening.

No mass, adenopathy, free fluid, or free air.

No hernia or acute osseous findings identified.
IMPRESSION: Acute appendicitis.

Findings called to Dr. Bayona on 10/25/2013 at 9294 hr.

## 2015-12-15 IMAGING — CT CT ABD-PELV W/ CM
2 of 5 series · 16 of 46 positions shown, 18 images · IV contrast (omnipaque)
Comparison: CT abdomen 10/25/2013, 11/23/2013

CLINICAL DATA: Right lower quadrant pain. History of appendicitis
in [REDACTED] treated with antibiotics.

EXAM:
CT ABDOMEN AND PELVIS WITH CONTRAST
TECHNIQUE: Multidetector CT imaging of the abdomen and pelvis was performed
using the standard protocol following bolus administration of
intravenous contrast.
CONTRAST:  100mL OMNIPAQUE IOHEXOL 300 MG/ML  SOLN

[Series 2: abd/ pelvis 5.0 i30f 1 · axial · 0.95mm/px · z∈[-858,-432]mm · 13 of 95 slices shown, 15 images]
[im 5/95  soft-tissue]
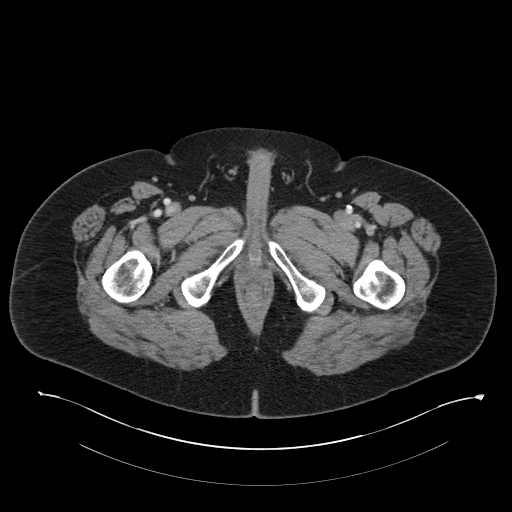
[im 5/95  bone]
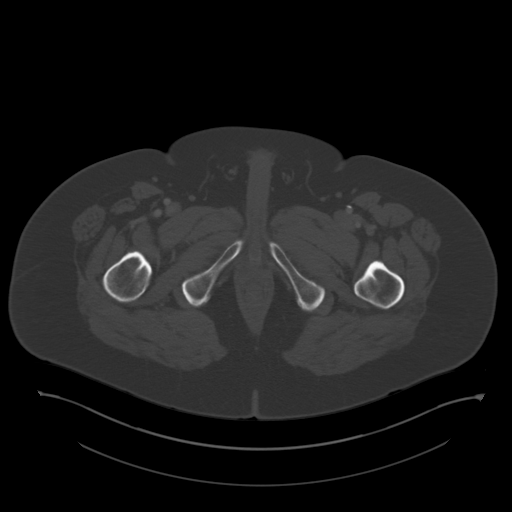
[im 15/95  soft-tissue]
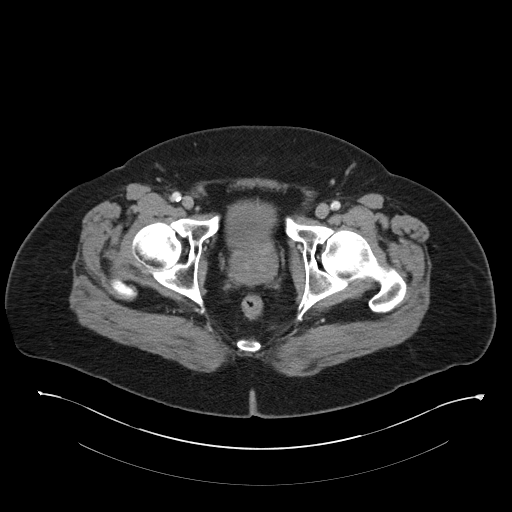
[im 20/95  soft-tissue]
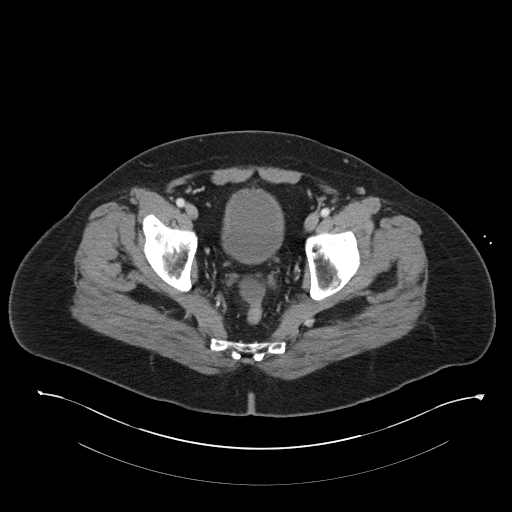
[im 25/95  soft-tissue]
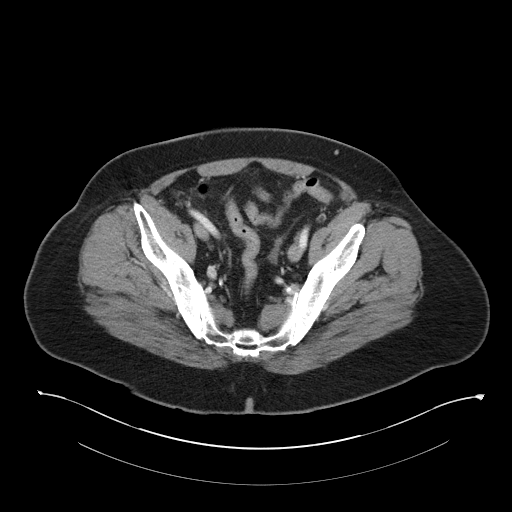
[im 35/95  soft-tissue]
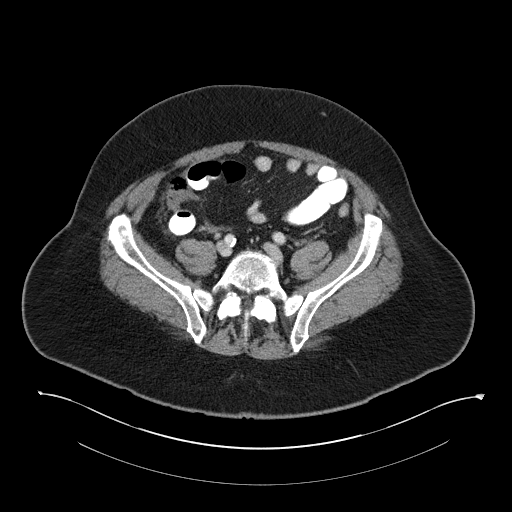
[im 40/95  soft-tissue]
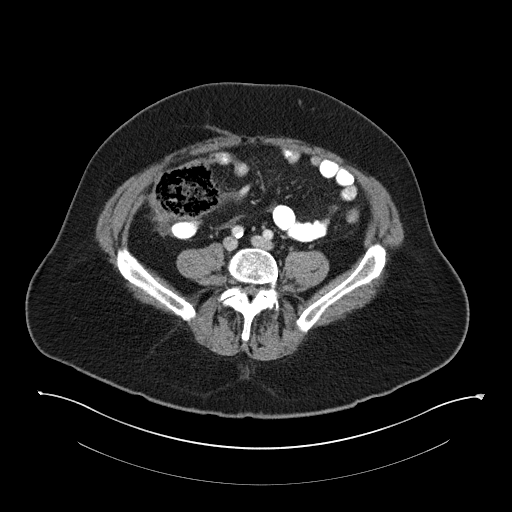
[im 50/95  soft-tissue]
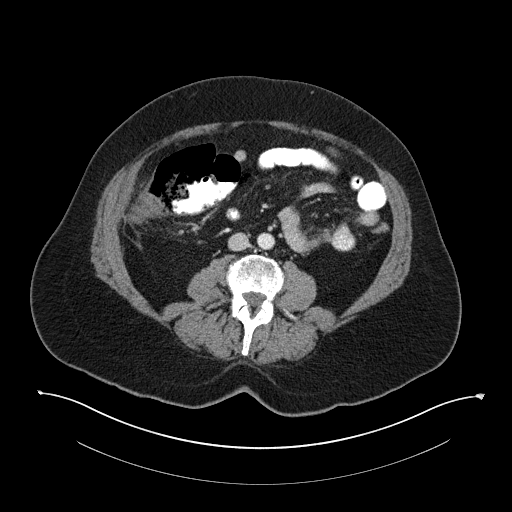
[im 55/95  soft-tissue]
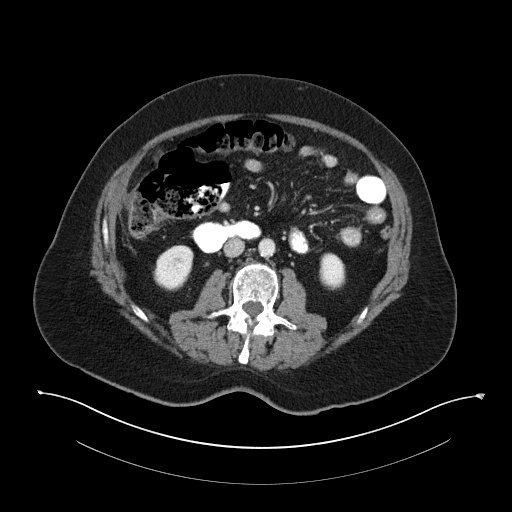
[im 60/95  soft-tissue]
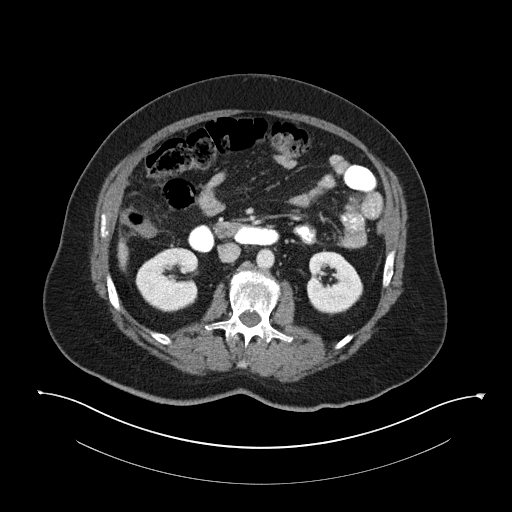
[im 60/95  bone]
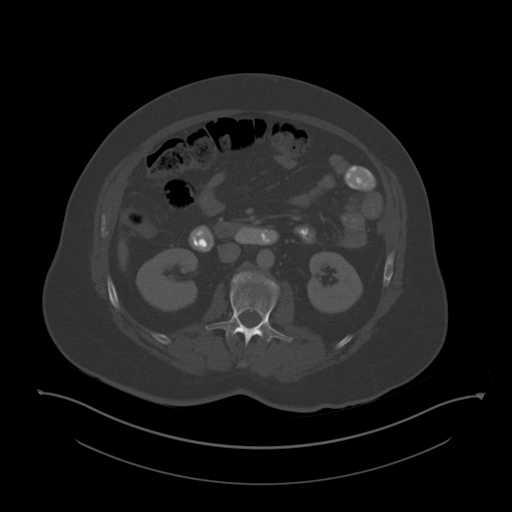
[im 70/95  soft-tissue]
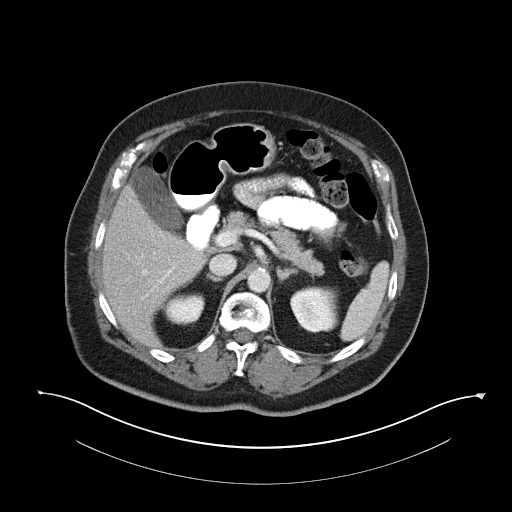
[im 75/95  soft-tissue]
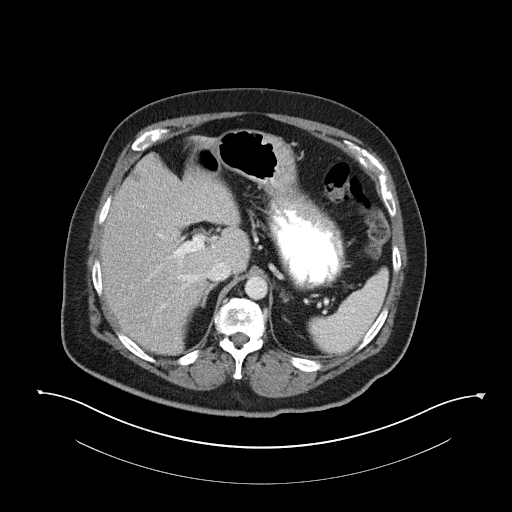
[im 80/95  soft-tissue]
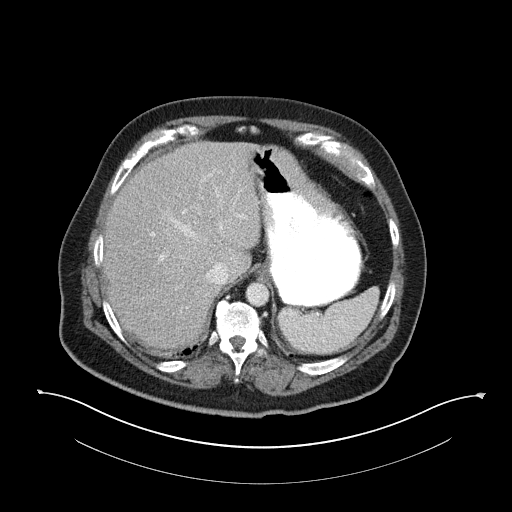
[im 90/95  soft-tissue]
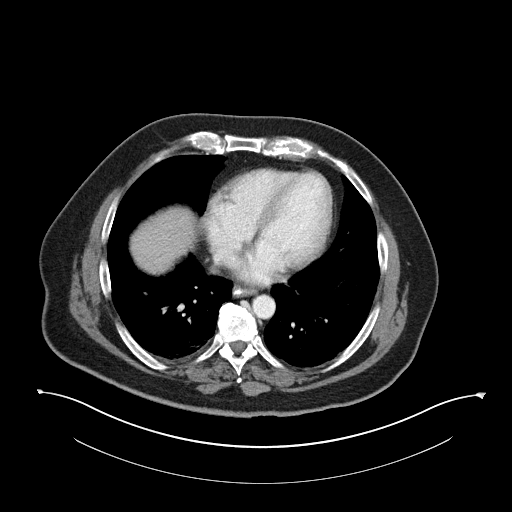

[Series 5: coronals · coronal · 0.79mm/px · 3 of 127 slices shown]
[im 43/127  soft-tissue]
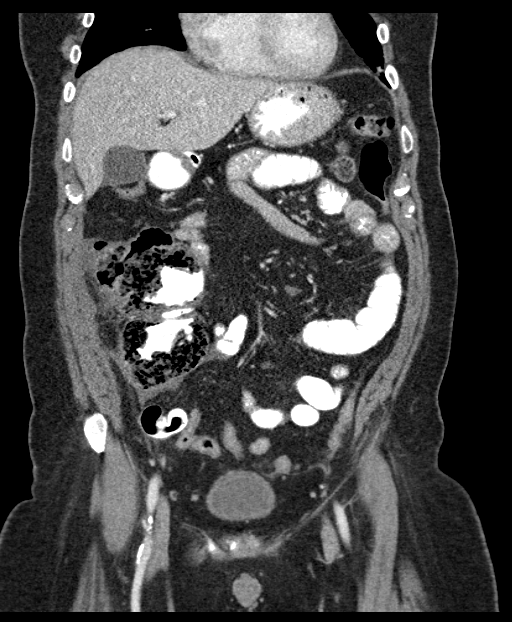
[im 57/127  soft-tissue]
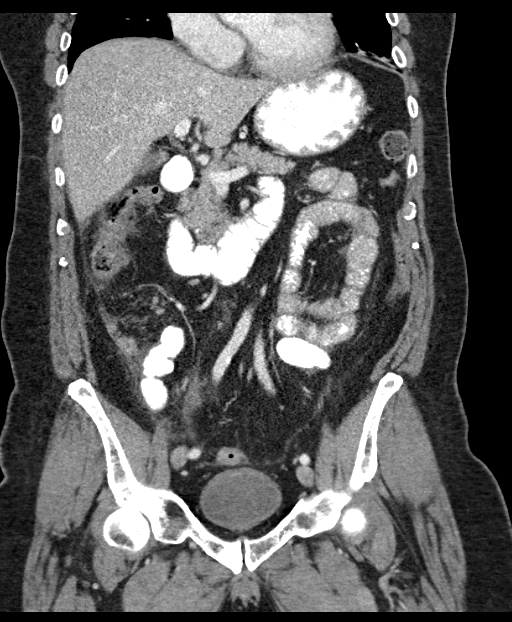
[im 71/127  soft-tissue]
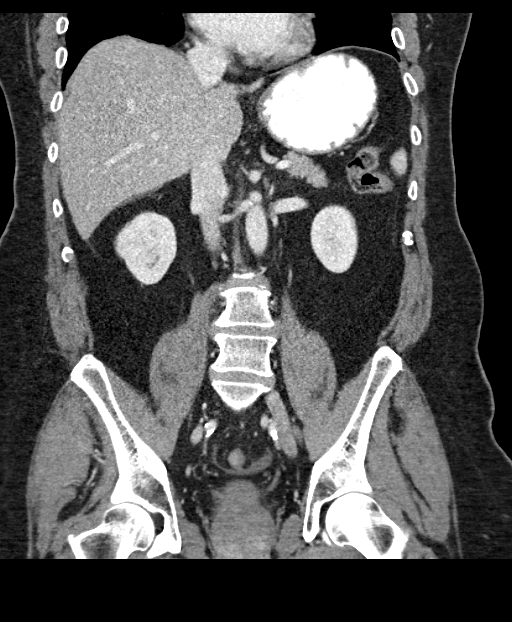

[16 of 46 positions shown; findings below may reference images not displayed]

FINDINGS: Findings compatible with acute appendicitis. There is appendiceal
thickening and periappendiceal edema. The appearance is similar to
the CT of 10/25/2013. This resolved and the appendix was normal on
11/23/2013. Negative for appendicolith. No abscess or free fluid.

Negative for bowel obstruction. 15 mm hypodensity in the lateral
segment left lobe liver is unchanged and may represent focal fatty
infiltration. This is nonspecific. No other liver lesions.
Gallbladder and bile ducts are normal. Pancreas spleen and kidneys
are normal.

Negative for mass or adenopathy. Prostate enlargement. No acute
spinal abnormality. Mild degenerative change.
IMPRESSION: Findings consistent with acute appendicitis without abscess. Note
the patient had appendicitis 10/25/2013 which resolved on
antibiotics and has recurred.
